# Patient Record
Sex: Female | Born: 1965 | Race: White | Hispanic: No | Smoking: Never smoker
Health system: Southern US, Community
[De-identification: ages and names within clinical notes are randomized; demographics above are authoritative.]

## PROBLEM LIST (undated history)

## (undated) DIAGNOSIS — R112 Nausea with vomiting, unspecified: Secondary | ICD-10-CM

## (undated) DIAGNOSIS — F32A Depression, unspecified: Secondary | ICD-10-CM

## (undated) DIAGNOSIS — F329 Major depressive disorder, single episode, unspecified: Secondary | ICD-10-CM

## (undated) DIAGNOSIS — Z460 Encounter for fitting and adjustment of spectacles and contact lenses: Secondary | ICD-10-CM

## (undated) DIAGNOSIS — I10 Essential (primary) hypertension: Secondary | ICD-10-CM

## (undated) DIAGNOSIS — Z9889 Other specified postprocedural states: Secondary | ICD-10-CM

## (undated) DIAGNOSIS — S83511A Sprain of anterior cruciate ligament of right knee, initial encounter: Secondary | ICD-10-CM

## (undated) DIAGNOSIS — S83281A Other tear of lateral meniscus, current injury, right knee, initial encounter: Secondary | ICD-10-CM

## (undated) HISTORY — PX: BREAST EXCISIONAL BIOPSY: SUR124

---

## 1998-01-20 ENCOUNTER — Ambulatory Visit (HOSPITAL_COMMUNITY): Admission: RE | Admit: 1998-01-20 | Discharge: 1998-01-20 | Payer: Self-pay | Admitting: Obstetrics and Gynecology

## 1998-04-19 ENCOUNTER — Other Ambulatory Visit: Admission: RE | Admit: 1998-04-19 | Discharge: 1998-04-19 | Payer: Self-pay | Admitting: Obstetrics & Gynecology

## 1998-11-18 ENCOUNTER — Inpatient Hospital Stay (HOSPITAL_COMMUNITY): Admission: AD | Admit: 1998-11-18 | Discharge: 1998-11-20 | Payer: Self-pay | Admitting: Obstetrics and Gynecology

## 1999-11-26 HISTORY — PX: BREAST SURGERY: SHX581

## 1999-11-26 HISTORY — PX: BREAST EXCISIONAL BIOPSY: SUR124

## 2012-11-25 HISTORY — PX: ENDOMETRIAL ABLATION: SHX621

## 2014-01-20 ENCOUNTER — Other Ambulatory Visit: Payer: Self-pay | Admitting: Orthopedic Surgery

## 2014-01-20 ENCOUNTER — Encounter (HOSPITAL_BASED_OUTPATIENT_CLINIC_OR_DEPARTMENT_OTHER): Payer: Self-pay | Admitting: *Deleted

## 2014-01-20 NOTE — Progress Notes (Signed)
[facility] and e[facility]-they did not do labs

## 2014-01-21 ENCOUNTER — Encounter (HOSPITAL_BASED_OUTPATIENT_CLINIC_OR_DEPARTMENT_OTHER)
Admission: RE | Disposition: A | Discharge status: Home / Self Care | Payer: Self-pay | Source: Ambulatory Visit | Attending: Orthopedic Surgery

## 2014-01-21 ENCOUNTER — Encounter (HOSPITAL_BASED_OUTPATIENT_CLINIC_OR_DEPARTMENT_OTHER): Payer: Self-pay | Admitting: Certified Registered"

## 2014-01-21 ENCOUNTER — Ambulatory Visit (HOSPITAL_BASED_OUTPATIENT_CLINIC_OR_DEPARTMENT_OTHER)
Admission: RE | Admit: 2014-01-21 | Discharge: 2014-01-21 | Disposition: A | Discharge status: Home / Self Care | Payer: Managed Care, Other (non HMO) | Source: Ambulatory Visit | Attending: Orthopedic Surgery | Admitting: Orthopedic Surgery

## 2014-01-21 ENCOUNTER — Ambulatory Visit (HOSPITAL_BASED_OUTPATIENT_CLINIC_OR_DEPARTMENT_OTHER): Payer: Managed Care, Other (non HMO) | Admitting: Certified Registered"

## 2014-01-21 ENCOUNTER — Encounter (HOSPITAL_BASED_OUTPATIENT_CLINIC_OR_DEPARTMENT_OTHER): Payer: Managed Care, Other (non HMO) | Admitting: Certified Registered"

## 2014-01-21 DIAGNOSIS — S83281A Other tear of lateral meniscus, current injury, right knee, initial encounter: Secondary | ICD-10-CM

## 2014-01-21 DIAGNOSIS — I10 Essential (primary) hypertension: Secondary | ICD-10-CM | POA: Insufficient documentation

## 2014-01-21 DIAGNOSIS — Y9323 Activity, snow (alpine) (downhill) skiing, snow boarding, sledding, tobogganing and snow tubing: Secondary | ICD-10-CM | POA: Insufficient documentation

## 2014-01-21 DIAGNOSIS — S83509A Sprain of unspecified cruciate ligament of unspecified knee, initial encounter: Secondary | ICD-10-CM | POA: Insufficient documentation

## 2014-01-21 DIAGNOSIS — S83511A Sprain of anterior cruciate ligament of right knee, initial encounter: Secondary | ICD-10-CM

## 2014-01-21 DIAGNOSIS — S83289A Other tear of lateral meniscus, current injury, unspecified knee, initial encounter: Secondary | ICD-10-CM | POA: Insufficient documentation

## 2014-01-21 DIAGNOSIS — F329 Major depressive disorder, single episode, unspecified: Secondary | ICD-10-CM | POA: Insufficient documentation

## 2014-01-21 DIAGNOSIS — F3289 Other specified depressive episodes: Secondary | ICD-10-CM | POA: Insufficient documentation

## 2014-01-21 HISTORY — DX: Other specified postprocedural states: Z98.890

## 2014-01-21 HISTORY — DX: Other tear of lateral meniscus, current injury, right knee, initial encounter: S83.281A

## 2014-01-21 HISTORY — DX: Sprain of anterior cruciate ligament of right knee, initial encounter: S83.511A

## 2014-01-21 HISTORY — DX: Essential (primary) hypertension: I10

## 2014-01-21 HISTORY — PX: ANTERIOR CRUCIATE LIGAMENT REPAIR: SHX115

## 2014-01-21 HISTORY — DX: Other specified postprocedural states: R11.2

## 2014-01-21 HISTORY — DX: Depression, unspecified: F32.A

## 2014-01-21 HISTORY — DX: Encounter for fitting and adjustment of spectacles and contact lenses: Z46.0

## 2014-01-21 HISTORY — DX: Nausea with vomiting, unspecified: R11.2

## 2014-01-21 HISTORY — DX: Major depressive disorder, single episode, unspecified: F32.9

## 2014-01-21 LAB — POCT I-STAT, CHEM 8
BUN: 21 mg/dL (ref 6–23)
Calcium, Ion: 1.24 mmol/L — ABNORMAL HIGH (ref 1.12–1.23)
Chloride: 104 mEq/L (ref 96–112)
Creatinine, Ser: 0.7 mg/dL (ref 0.50–1.10)
GLUCOSE: 81 mg/dL (ref 70–99)
HCT: 42 % (ref 36.0–46.0)
HEMOGLOBIN: 14.3 g/dL (ref 12.0–15.0)
Potassium: 3.9 mEq/L (ref 3.7–5.3)
Sodium: 141 mEq/L (ref 137–147)
TCO2: 28 mmol/L (ref 0–100)

## 2014-01-21 SURGERY — RECONSTRUCTION, KNEE, ACL, USING HAMSTRING GRAFT
Anesthesia: Regional | Site: Knee | Laterality: Right

## 2014-01-21 MED ORDER — LACTATED RINGERS IV SOLN
INTRAVENOUS | Status: DC
  Administered 2014-01-21 (×2): via INTRAVENOUS

## 2014-01-21 MED ORDER — CEFAZOLIN SODIUM-DEXTROSE 2-3 GM-% IV SOLR
INTRAVENOUS | Status: AC
  Filled 2014-01-21: qty 50

## 2014-01-21 MED ORDER — MIDAZOLAM HCL 2 MG/2ML IJ SOLN: 0.5000 mg | Freq: Once | INTRAMUSCULAR | Status: DC

## 2014-01-21 MED ORDER — HYDROMORPHONE HCL PF 1 MG/ML IJ SOLN
INTRAMUSCULAR | Status: AC
  Filled 2014-01-21: qty 1

## 2014-01-21 MED ORDER — DEXAMETHASONE SODIUM PHOSPHATE 10 MG/ML IJ SOLN
INTRAMUSCULAR | Status: DC | PRN
  Administered 2014-01-21: 10 mg via INTRAVENOUS

## 2014-01-21 MED ORDER — PROMETHAZINE HCL 25 MG PO TABS: 25.0000 mg | ORAL_TABLET | Freq: Four times a day (QID) | ORAL | Status: DC | PRN

## 2014-01-21 MED ORDER — SODIUM CHLORIDE 0.9 % IR SOLN
Status: DC | PRN
  Administered 2014-01-21: 9000 mL

## 2014-01-21 MED ORDER — CEFAZOLIN SODIUM-DEXTROSE 2-3 GM-% IV SOLR
2.0000 g | INTRAVENOUS | Status: AC
  Administered 2014-01-21: 2 g via INTRAVENOUS

## 2014-01-21 MED ORDER — FENTANYL CITRATE 0.05 MG/ML IJ SOLN
INTRAMUSCULAR | Status: AC
  Filled 2014-01-21: qty 2

## 2014-01-21 MED ORDER — ONDANSETRON HCL 4 MG/2ML IJ SOLN
INTRAMUSCULAR | Status: DC | PRN
  Administered 2014-01-21: 4 mg via INTRAVENOUS

## 2014-01-21 MED ORDER — EPHEDRINE SULFATE 50 MG/ML IJ SOLN
INTRAMUSCULAR | Status: DC | PRN
  Administered 2014-01-21: 10 mg via INTRAVENOUS

## 2014-01-21 MED ORDER — PROPOFOL 10 MG/ML IV BOLUS
INTRAVENOUS | Status: DC | PRN
  Administered 2014-01-21: 150 mg via INTRAVENOUS

## 2014-01-21 MED ORDER — OXYCODONE-ACETAMINOPHEN 10-325 MG PO TABS: 1.0000 | ORAL_TABLET | Freq: Four times a day (QID) | ORAL | Status: DC | PRN

## 2014-01-21 MED ORDER — PROMETHAZINE HCL 25 MG PO TABS
ORAL_TABLET | ORAL | Status: AC
  Filled 2014-01-21: qty 1

## 2014-01-21 MED ORDER — METHOCARBAMOL 500 MG PO TABS
500.0000 mg | ORAL_TABLET | Freq: Four times a day (QID) | ORAL | Status: DC
Start: 2014-01-21 — End: 2017-05-06

## 2014-01-21 MED ORDER — SENNA-DOCUSATE SODIUM 8.6-50 MG PO TABS: 2.0000 | ORAL_TABLET | Freq: Every day | ORAL | Status: DC

## 2014-01-21 MED ORDER — OXYCODONE HCL 5 MG PO TABS
ORAL_TABLET | ORAL | Status: AC
  Filled 2014-01-21: qty 1

## 2014-01-21 MED ORDER — MIDAZOLAM HCL 2 MG/2ML IJ SOLN
INTRAMUSCULAR | Status: AC
  Filled 2014-01-21: qty 2

## 2014-01-21 MED ORDER — FENTANYL CITRATE 0.05 MG/ML IJ SOLN
INTRAMUSCULAR | Status: DC | PRN
  Administered 2014-01-21 (×5): 25 ug via INTRAVENOUS

## 2014-01-21 MED ORDER — FENTANYL CITRATE 0.05 MG/ML IJ SOLN
INTRAMUSCULAR | Status: AC
  Filled 2014-01-21: qty 6

## 2014-01-21 MED ORDER — PROMETHAZINE HCL 25 MG PO TABS
25.0000 mg | ORAL_TABLET | Freq: Once | ORAL | Status: AC
  Administered 2014-01-21: 25 mg via ORAL

## 2014-01-21 MED ORDER — MIDAZOLAM HCL 5 MG/5ML IJ SOLN
INTRAMUSCULAR | Status: DC | PRN
  Administered 2014-01-21: 2 mg via INTRAVENOUS

## 2014-01-21 MED ORDER — BUPIVACAINE-EPINEPHRINE PF 0.5-1:200000 % IJ SOLN
INTRAMUSCULAR | Status: DC | PRN
  Administered 2014-01-21: 30 mL via PERINEURAL

## 2014-01-21 MED ORDER — FENTANYL CITRATE 0.05 MG/ML IJ SOLN
50.0000 ug | INTRAMUSCULAR | Status: DC | PRN
  Administered 2014-01-21: 100 ug via INTRAVENOUS

## 2014-01-21 MED ORDER — MIDAZOLAM HCL 2 MG/2ML IJ SOLN
1.0000 mg | INTRAMUSCULAR | Status: DC | PRN
  Administered 2014-01-21: 2 mg via INTRAVENOUS

## 2014-01-21 MED ORDER — LIDOCAINE HCL (CARDIAC) 20 MG/ML IV SOLN
INTRAVENOUS | Status: DC | PRN
  Administered 2014-01-21: 30 mg via INTRAVENOUS

## 2014-01-21 MED ORDER — OXYCODONE HCL 5 MG/5ML PO SOLN: 5.0000 mg | Freq: Once | ORAL | Status: AC | PRN

## 2014-01-21 MED ORDER — HYDROMORPHONE HCL PF 1 MG/ML IJ SOLN
0.2500 mg | INTRAMUSCULAR | Status: DC | PRN
  Administered 2014-01-21 (×3): 0.5 mg via INTRAVENOUS

## 2014-01-21 MED ORDER — OXYCODONE HCL 5 MG PO TABS
5.0000 mg | ORAL_TABLET | Freq: Once | ORAL | Status: AC | PRN
  Administered 2014-01-21: 5 mg via ORAL

## 2014-01-21 SURGICAL SUPPLY — 81 items
ANCHOR BUTTON TIGHTROPE ACL RT (Orthopedic Implant) ×1 IMPLANT
APL SKNCLS STERI-STRIP NONHPOA (GAUZE/BANDAGES/DRESSINGS) ×1
BANDAGE ELASTIC 6 VELCRO ST LF (GAUZE/BANDAGES/DRESSINGS) ×1 IMPLANT
BANDAGE ESMARK 6X9 LF (GAUZE/BANDAGES/DRESSINGS) IMPLANT
BENZOIN TINCTURE PRP APPL 2/3 (GAUZE/BANDAGES/DRESSINGS) ×2 IMPLANT
BLADE 4.2CUDA (BLADE) IMPLANT
BLADE CUDA GRT WHITE 3.5 (BLADE) IMPLANT
BLADE CUDA SHAVER 3.5 (BLADE) IMPLANT
BLADE CUTTER GATOR 3.5 (BLADE) ×2 IMPLANT
BLADE GREAT WHITE 4.2 (BLADE) IMPLANT
BLADE SURG 15 STRL LF DISP TIS (BLADE) ×1 IMPLANT
BLADE SURG 15 STRL SS (BLADE) ×2
BNDG CMPR 9X6 STRL LF SNTH (GAUZE/BANDAGES/DRESSINGS)
BNDG ESMARK 6X9 LF (GAUZE/BANDAGES/DRESSINGS)
BUR OVAL 4.0 (BURR) ×2 IMPLANT
BUR OVAL 6.0 (BURR) IMPLANT
CANISTER SUCT 3000ML (MISCELLANEOUS) IMPLANT
CINCH MENISCAL (Anchor) IMPLANT
COVER TABLE BACK 60X90 (DRAPES) ×3 IMPLANT
CUFF TOURNIQUET SINGLE 34IN LL (TOURNIQUET CUFF) ×1 IMPLANT
CUTTER KNOT PUSHER 2-0 FIBERWI (INSTRUMENTS) ×1 IMPLANT
CUTTER MENISCUS  4.2MM (BLADE)
CUTTER MENISCUS 4.2MM (BLADE) IMPLANT
DRAPE ARTHROSCOPY W/POUCH 114 (DRAPES) ×2 IMPLANT
DRAPE OEC MINIVIEW 54X84 (DRAPES) ×2 IMPLANT
DRAPE U-SHAPE 47X51 STRL (DRAPES) ×2 IMPLANT
DRILL FLIPCUTTER II 9.0MM (INSTRUMENTS) IMPLANT
DURAPREP 26ML APPLICATOR (WOUND CARE) ×2 IMPLANT
ELECT REM PT RETURN 9FT ADLT (ELECTROSURGICAL) ×2
ELECTRODE REM PT RTRN 9FT ADLT (ELECTROSURGICAL) ×1 IMPLANT
FIBERSTICK 2 (SUTURE) ×2 IMPLANT
FLIPCUTTER II 9.0MM (INSTRUMENTS) ×2
GLOVE BIO SURGEON STRL SZ8 (GLOVE) ×2 IMPLANT
GLOVE BIOGEL PI IND STRL 8 (GLOVE) ×3 IMPLANT
GLOVE BIOGEL PI INDICATOR 8 (GLOVE) ×2
GLOVE ORTHO TXT STRL SZ7.5 (GLOVE) ×3 IMPLANT
GOWN STRL REUS W/ TWL LRG LVL3 (GOWN DISPOSABLE) ×1 IMPLANT
GOWN STRL REUS W/ TWL XL LVL3 (GOWN DISPOSABLE) ×4 IMPLANT
GOWN STRL REUS W/TWL LRG LVL3 (GOWN DISPOSABLE) ×2
GOWN STRL REUS W/TWL XL LVL3 (GOWN DISPOSABLE) ×4
GRAFT TISS ANT TIB TNDN (Tissue) IMPLANT
HOLDER KNEE FOAM BLUE (MISCELLANEOUS) ×1 IMPLANT
IMMOBILIZER KNEE 22 UNIV (SOFTGOODS) ×1 IMPLANT
IMMOBILIZER KNEE 24 THIGH 36 (MISCELLANEOUS) ×1 IMPLANT
IMMOBILIZER KNEE 24 UNIV (MISCELLANEOUS) ×2
KIT TRANSTIBIAL (DISPOSABLE) ×2 IMPLANT
KNEE WRAP E Z 3 GEL PACK (MISCELLANEOUS) ×2 IMPLANT
MANIFOLD NEPTUNE II (INSTRUMENTS) ×2 IMPLANT
MENISCAL CINCH (Anchor) ×2 IMPLANT
NS IRRIG 1000ML POUR BTL (IV SOLUTION) ×2 IMPLANT
PACK ARTHROSCOPY DSU (CUSTOM PROCEDURE TRAY) ×2 IMPLANT
PACK BASIN DAY SURGERY FS (CUSTOM PROCEDURE TRAY) ×2 IMPLANT
PAD CAST 4YDX4 CTTN HI CHSV (CAST SUPPLIES) ×1 IMPLANT
PADDING CAST COTTON 4X4 STRL (CAST SUPPLIES) ×2
PADDING CAST COTTON 6X4 STRL (CAST SUPPLIES) ×2 IMPLANT
PENCIL BUTTON HOLSTER BLD 10FT (ELECTRODE) IMPLANT
SCREW BIOCOMPOSITE 10X35 (Screw) ×1 IMPLANT
SET ARTHROSCOPY TUBING (MISCELLANEOUS) ×2
SET ARTHROSCOPY TUBING LN (MISCELLANEOUS) ×1 IMPLANT
SLEEVE SCD COMPRESS KNEE MED (MISCELLANEOUS) ×2 IMPLANT
SPONGE GAUZE 4X4 12PLY (GAUZE/BANDAGES/DRESSINGS) ×2 IMPLANT
SPONGE LAP 4X18 X RAY DECT (DISPOSABLE) ×2 IMPLANT
STRIP CLOSURE SKIN 1/2X4 (GAUZE/BANDAGES/DRESSINGS) ×2 IMPLANT
SUCTION FRAZIER TIP 10 FR DISP (SUCTIONS) ×1 IMPLANT
SUT 2 FIBERLOOP 20 STRT BLUE (SUTURE) ×4
SUT FIBERWIRE #2 38 T-5 BLUE (SUTURE)
SUT MNCRL AB 4-0 PS2 18 (SUTURE) ×2 IMPLANT
SUT VIC AB 0 CT1 27 (SUTURE)
SUT VIC AB 0 CT1 27XBRD ANBCTR (SUTURE) IMPLANT
SUT VIC AB 2-0 SH 27 (SUTURE)
SUT VIC AB 2-0 SH 27XBRD (SUTURE) IMPLANT
SUT VIC AB 3-0 SH 27 (SUTURE)
SUT VIC AB 3-0 SH 27X BRD (SUTURE) IMPLANT
SUT VICRYL 3-0 CR8 SH (SUTURE) IMPLANT
SUT VICRYL 4-0 PS2 18IN ABS (SUTURE) ×1 IMPLANT
SUTURE 2 FIBERLOOP 20 STRT BLU (SUTURE) IMPLANT
SUTURE FIBERWR #2 38 T-5 BLUE (SUTURE) IMPLANT
TENDON ANTERIOR TIBIALIS (Tissue) ×2 IMPLANT
TOWEL OR 17X24 6PK STRL BLUE (TOWEL DISPOSABLE) ×2 IMPLANT
WAND STAR VAC 90 (SURGICAL WAND) ×2 IMPLANT
WATER STERILE IRR 1000ML POUR (IV SOLUTION) ×2 IMPLANT

## 2014-01-21 NOTE — Anesthesia Postprocedure Evaluation (Signed)
Anesthesia Post Note  Patient: Sheralyn BoatmanLaura Freeburg  Procedure(s) Performed: Procedure(s) (LRB): LATERAL  MENISCAL REPAIR ANTERIOR CRUCIATE LIGAMENT ( ACL) RECONSTRUCTION  RIGHT KNEE WITH ALLOGRAFT (Right)  Anesthesia type: General  Patient location: PACU  Post pain: Pain level controlled and Adequate analgesia  Post assessment: Post-op Vital signs reviewed, Patient's Cardiovascular Status Stable, Respiratory Function Stable, Patent Airway and Pain level controlled  Last Vitals:  Filed Vitals:   01/21/14 1500  BP: 126/73  Pulse: 96  Temp:   Resp: 13    Post vital signs: Reviewed and stable  Level of consciousness: awake, alert  and oriented  Complications: No apparent anesthesia complications

## 2014-01-21 NOTE — Op Note (Signed)
01/21/2014  2:36 PM  PATIENT:  Abigail Garza    PRE-OPERATIVE DIAGNOSIS:  LATERAL & MEDIAL MENISCAL AND ACL TEAR RIGHT KNEE  POST-OPERATIVE DIAGNOSIS:  Right anterior cruciate ligament tear and lateral meniscal tear, no evidence for medial meniscus tear  PROCEDURE:  LATERAL  MENISCAL REPAIR AND ANTERIOR CRUCIATE LIGAMENT ( ACL) RECONSTRUCTION  RIGHT KNEE WITH ALLOGRAFT  SURGEON:  Eulas Post, MD  PHYSICIAN ASSISTANT: Janace Litten, OPA-C, present and scrubbed throughout the case, critical for completion in a timely fashion, and for retraction, instrumentation, and closure.  ANESTHESIA:   General  PREOPERATIVE INDICATIONS:  Abigail Garza is a  48 y.o. female who injured her knee while skiing, and elected for surgical management. She wanted to maintain the ability to do cutting and twisting pivoting activities. The risks benefits and alternatives were discussed with the patient preoperatively including but not limited to the risks of infection, bleeding, nerve injury, stiffness, cardiopulmonary complications, the need for revision surgery, recurrent instability, progression of arthritis, the potential for use of a allograft and related disease transmission risks, among others and the patient was willing to proceed.  .  OPERATIVE IMPLANTS: Arthrex anterior cruciate ligament tightrope, bio composite tibial interference screw size 10 x 35 mm and size 9 tibialis anterior allograft with an Arthrex meniscal cinch times one for the lateral meniscus  OPERATIVE FINDINGS: The anterior cruciate ligament was completely torn. The PCL was intact. The posterior lateral corner was intact to dial testing, and I examined this very closely compared to the contralateral side. Lateral collateral ligament was also intact. The medial meniscus was probed thoroughly, and found to be intact, with no loose fragments or instability. The lateral meniscus had a peripheral tear, that was in the red red zone, and was  amenable to repair. The superior aspect of the tear was less than a centimeter, but inferiorly was longer than a centimeter, but did not extend to the central portion of the meniscus. The patellofemoral joint was intact, the gutters were normal, and the chondral integrity of the medial and lateral compartment was in reasonably good condition.   OPERATIVE PROCEDURE: The patient was brought to the operating room and placed in the supine position. General anesthesia was administered. IV antibiotics were given. The lower extremity was prepped and draped in usual sterile fashion. Exam under anesthesia demonstrated the above-named findings. Time out was performed.  Knee arthroscopy was then performed, and the above named findings were noted.    The anterior cruciate ligament was torn.  I used the meniscal cinch in a vertical mattress configuration to repair the lateral meniscus. The depth stop was set to 16 mm. Excellent soft tissue tension and restoration of meniscal integrity was achieved.  I then removed the previous anterior cruciate ligament stump, and performed a mild notchplasty.  The outside in guide was then applied to the appropriate position and the 9 retro-cutter was used to drill the femoral socket. Care was taken to maintain the cortical bridge.  I then drilled the tibial tunnel using the retro-cutter, and opened the cortex with a reamer. All the soft tissue remnants were removed and cleaned at the aperture of the tunnel.  I also dilated with the appropriate dilators.  The passing suture was delivered through the tibia, and then the button and graft delivered up into the femoral tunnel.  The button was flipped and confirmed under live fluoroscopy. I then tensioned the anterior cruciate ligament tightrope, and deliver the graft up into the femoral tunnel. Over 25 mm  of graft was in the femoral tunnel. I confirmed once more with the fluoroscopy that the button was flipped appropriately on  the femoral cortex.  I then cycled the knee, eliminated all of the creep, and I had excellent isometry. I then applied tension, and the Arthrex bio composite interference screw into the tibia placing a reverse Lachman maneuver on the tibia and femur. I removed the guide pin prior to completely seating the screw.  Excellent fixation was achieved on both the femoral and tibial side, and the wounds were irrigated copiously and the sartorius fascia repaired with Vicryl, and the portals repaired with Monocryl with Steri-Strips and sterile gauze.  The patient was awakened and returned to PACU in stable and satisfactory condition. There were no complications and She tolerated the procedure well.

## 2014-01-21 NOTE — Anesthesia Preprocedure Evaluation (Addendum)
Anesthesia Evaluation  Patient identified by MRN, date of birth, ID band Patient awake    Reviewed: Allergy & Precautions, H&P , NPO status , Patient's Chart, lab work & pertinent test results  History of Anesthesia Complications (+) PONV and history of anesthetic complications  Airway Mallampati: II TM Distance: >3 FB Neck ROM: Full    Dental no notable dental hx. (+) Teeth Intact, Dental Advisory Given   Pulmonary neg pulmonary ROS,  breath sounds clear to auscultation  Pulmonary exam normal       Cardiovascular hypertension, On Medications Rhythm:Regular Rate:Normal     Neuro/Psych Depression negative neurological ROS     GI/Hepatic negative GI ROS, Neg liver ROS,   Endo/Other  negative endocrine ROS  Renal/GU negative Renal ROS  negative genitourinary   Musculoskeletal   Abdominal   Peds  Hematology negative hematology ROS (+)   Anesthesia Other Findings   Reproductive/Obstetrics negative OB ROS                          Anesthesia Physical Anesthesia Plan  ASA: II  Anesthesia Plan: General and Regional   Post-op Pain Management:    Induction: Intravenous  Airway Management Planned: LMA  Additional Equipment:   Intra-op Plan:   Post-operative Plan: Extubation in OR  Informed Consent: I have reviewed the patients History and Physical, chart, labs and discussed the procedure including the risks, benefits and alternatives for the proposed anesthesia with the patient or authorized representative who has indicated his/her understanding and acceptance.   Dental advisory given  Plan Discussed with: CRNA  Anesthesia Plan Comments:         Anesthesia Quick Evaluation

## 2014-01-21 NOTE — Discharge Instructions (Signed)
Diet: As you were doing prior to hospitalization   Shower:  May shower but keep the wounds dry, use an occlusive plastic wrap, NO SOAKING IN TUB.  If the bandage gets wet, change with a clean dry gauze.  Dressing:  You may change your dressing 3-5 days after surgery.  Then change the dressing daily with sterile gauze dressing.    There are sticky tapes (steri-strips) on your wounds and all the stitches are absorbable.  Leave the steri-strips in place when changing your dressings, they will peel off with time, usually 2-3 weeks.  Activity:  Increase activity slowly as tolerated, but follow the weight bearing instructions below.  No lifting or driving for 6 weeks.  Weight Bearing:   Nonweightbearing right leg for 6 weeks.    To prevent constipation: you may use a stool softener such as -  Colace (over the counter) 100 mg by mouth twice a day  Drink plenty of fluids (prune juice may be helpful) and high fiber foods Miralax (over the counter) for constipation as needed.    Itching:  If you experience itching with your medications, try taking only a single pain pill, or even half a pain pill at a time.  You may take up to 10 pain pills per day, and you can also use benadryl over the counter for itching or also to help with sleep.   Precautions:  If you experience chest pain or shortness of breath - call 911 immediately for transfer to the hospital emergency department!!  If you develop a fever greater that 101 F, purulent drainage from wound, increased redness or drainage from wound, or calf pain -- Call the office at [phone]                                                Follow- Up Appointment:  Please call for an appointment to be [place] in 2 [place] [phone]   Post Anesthesia Home Care Instructions  Activity: Get plenty of rest for the remainder of the day. A responsible adult should stay with you for 24 hours following the procedure.  For the next 24 hours, DO  NOT: -Drive a car -Advertising copywriterperate machinery -Drink alcoholic beverages -Take any medication unless instructed by your physician -Make any legal decisions or sign important papers.  Meals: Start with liquid foods such as gelatin or soup. Progress to regular foods as tolerated. Avoid greasy, spicy, heavy foods. If nausea and/or vomiting occur, drink only clear liquids until the nausea and/or vomiting subsides. Call your physician if vomiting continues.  Special Instructions/Symptoms: Your throat may feel dry or sore from the anesthesia or the breathing tube placed in your throat during surgery. If this causes discomfort, gargle with warm salt water. The discomfort should disappear within 24 hours. Regional Anesthesia Blocks  1. Numbness or the inability to move the "blocked" extremity may last from 3-48 hours after placement. The length of time depends on the medication injected and your individual response to the medication. If the numbness is not going away after 48 hours, call your surgeon.  2. The extremity that is blocked will need to be protected until the numbness is gone and the  Strength has returned. Because you cannot feel it, you will need to take extra care to avoid injury. Because it may be weak, you may have difficulty moving  it or using it. You may not know what position it is in without looking at it while the block is in effect.  3. For blocks in the legs and feet, returning to weight bearing and walking needs to be done carefully. You will need to wait until the numbness is entirely gone and the strength has returned. You should be able to move your leg and foot normally before you try and bear weight or walk. You will need someone to be with you when you first try to ensure you do not fall and possibly risk injury.  4. Bruising and tenderness at the needle site are common side effects and will resolve in a few days.  5. Persistent numbness or new problems with movement should be  communicated to the surgeon or the [facility] [phone] [facility] [phone]).

## 2014-01-21 NOTE — Progress Notes (Signed)
Assisted Dr. Fitzgerald with right, ultrasound guided, femoral block. Side rails up, monitors on throughout procedure. See vital signs in flow sheet. Tolerated Procedure well. 

## 2014-01-21 NOTE — H&P (Signed)
  PREOPERATIVE H&P  Chief Complaint: LATERNAL MEDIAL MENISCAL AND ACL TEAR RIGHT KNEE  HPI: Abigail Garza is a 48 y.o. female who presents for preoperative history and physical with a diagnosis of LATERNAL MEDIAL MENISCAL AND ACL TEAR RIGHT KNEE. Symptoms are rated as moderate to severe, and have been worsening.  This is significantly impairing activities of daily living.  She has elected for surgical management. This occurred after a skiing accident. She's had severe pain, with episodes of instability.  Past Medical History  Diagnosis Date  . Hypertension   . Depression   . PONV (postoperative nausea and vomiting)   . Contact lens/glasses fitting     wears contacts or glasses   Past Surgical History  Procedure Laterality Date  . Endometrial ablation  2014  . Breast surgery  2001    lt br bx   History   Social History  . Marital Status: Divorced    Spouse Name: N/A    Number of Children: N/A  . Years of Education: N/A   Social History Main Topics  . Smoking status: Never Smoker   . Smokeless tobacco: None  . Alcohol Use: Yes     Comment: occ  . Drug Use: No  . Sexual Activity: None   Other Topics Concern  . None   Social History Narrative  . None   History reviewed. No pertinent family history. Allergies  Allergen Reactions  . Sulfa Antibiotics Rash   Prior to Admission medications   Medication Sig Start Date End Date Taking? Authorizing Provider  citalopram (CELEXA) 10 MG tablet Take 10 mg by mouth daily.   Yes Historical Provider, MD  furosemide (LASIX) 20 MG tablet Take 20 mg by mouth. Takes 1/2   Yes Historical Provider, MD  losartan (COZAAR) 50 MG tablet Take 50 mg by mouth daily.   Yes Historical Provider, MD     Positive ROS: All other systems have been reviewed and were otherwise negative with the exception of those mentioned in the HPI and as above.  Physical Exam: General: Alert, no acute distress Cardiovascular: No pedal edema Respiratory: No  cyanosis, no use of accessory musculature GI: No organomegaly, abdomen is soft and non-tender Skin: No lesions in the area of chief complaint Neurologic: Sensation intact distally Psychiatric: Patient is competent for consent with normal mood and affect Lymphatic: No axillary or cervical lymphadenopathy  MUSCULOSKELETAL: right knee has positive Lachman, medial and lateral joint line tenderness, her dial test is symmetric with the contralateral side, stable to varus stress.  Assessment: Lateral and MEDIAL MENISCAL AND ACL TEAR RIGHT KNEE  Plan: Plan for Procedure(s): LATERAL AND MEDIAL MENISCAL REPAIR versus meniscectomy with ANTERIOR CRUCIATE LIGAMENT ( ACL) RECONSTRUCTION  RIGHT KNEE WITH ALLOGRAFT  The risks benefits and alternatives were discussed with the patient including but not limited to the risks of nonoperative treatment, versus surgical intervention including infection, bleeding, nerve injury,  blood clots, cardiopulmonary complications, morbidity, mortality, among others, and they were willing to proceed. We've also discussed the risk for re\re rupture, stiffness, posttraumatic arthritis, the potential for other associated ligamentous injury.  Eulas PostLANDAU,Amberley Hamler P, MD Cell [phone]   01/21/2014 11:35 AM

## 2014-01-21 NOTE — Transfer of Care (Signed)
Immediate Anesthesia Transfer of Care Note  Patient: Abigail BoatmanLaura Garza  Procedure(s) Performed: Procedure(s): LATERAL  MENISCAL REPAIR ANTERIOR CRUCIATE LIGAMENT ( ACL) RECONSTRUCTION  RIGHT KNEE WITH ALLOGRAFT (Right)  Patient Location: PACU  Anesthesia Type:GA combined with regional for post-op pain  Level of Consciousness: awake and patient cooperative  Airway & Oxygen Therapy: Patient Spontanous Breathing and Patient connected to face mask oxygen  Post-op Assessment: Report given to PACU RN and Post -op Vital signs reviewed and stable  Post vital signs: Reviewed and stable  Complications: No apparent anesthesia complications

## 2014-01-21 NOTE — Anesthesia Procedure Notes (Addendum)
Anesthesia Regional Block:  Femoral nerve block  Pre-Anesthetic Checklist: ,, timeout performed, Correct Patient, Correct Site, Correct Laterality, Correct Procedure, Correct Position, site marked, Risks and benefits discussed, pre-op evaluation,  At surgeon's request and post-op pain management  Laterality: Right  Prep: Maximum Sterile Barrier Precautions used and chloraprep       Needles:  Injection technique: Single-shot  Needle Type: Echogenic Stimulator Needle     Needle Length: 5cm 5 cm Needle Gauge: 22 and 22 G    Additional Needles:  Procedures: ultrasound guided (picture in chart) Femoral nerve block  Nerve Stimulator or Paresthesia:  Response: Patellar respose,   Additional Responses:   Narrative:  Start time: 01/21/2014 12:06 PM End time: 01/21/2014 12:16 PM Injection made incrementally with aspirations every 5 mL. Anesthesiologist: Fitzgerald,MD  Additional Notes: 2% Lidocaine skin wheel.    Procedure Name: LMA Insertion Date/Time: 01/21/2014 12:49 PM Performed by: Nalee Lightle Pre-anesthesia Checklist: Patient identified, Emergency Drugs available, Suction available and Patient being monitored Patient Re-evaluated:Patient Re-evaluated prior to inductionOxygen Delivery Method: Circle System Utilized Preoxygenation: Pre-oxygenation with 100% oxygen Intubation Type: IV induction Ventilation: Mask ventilation without difficulty LMA: LMA inserted LMA Size: 4.0 Number of attempts: 1 Airway Equipment and Method: bite block Placement Confirmation: positive ETCO2 Tube secured with: Tape Dental Injury: Teeth and Oropharynx as per pre-operative assessment

## 2014-01-24 ENCOUNTER — Encounter (HOSPITAL_BASED_OUTPATIENT_CLINIC_OR_DEPARTMENT_OTHER): Payer: Self-pay | Admitting: Orthopedic Surgery

## 2017-05-06 ENCOUNTER — Ambulatory Visit (INDEPENDENT_AMBULATORY_CARE_PROVIDER_SITE_OTHER): Payer: Managed Care, Other (non HMO) | Admitting: Sports Medicine

## 2017-05-06 ENCOUNTER — Encounter: Payer: Self-pay | Admitting: Sports Medicine

## 2017-05-06 ENCOUNTER — Ambulatory Visit (INDEPENDENT_AMBULATORY_CARE_PROVIDER_SITE_OTHER): Payer: Managed Care, Other (non HMO)

## 2017-05-06 DIAGNOSIS — M205X2 Other deformities of toe(s) (acquired), left foot: Secondary | ICD-10-CM

## 2017-05-06 DIAGNOSIS — M779 Enthesopathy, unspecified: Secondary | ICD-10-CM

## 2017-05-06 NOTE — Progress Notes (Signed)
Subjective: Jeffie PollockLaura M Deckman is a 51 y.o. female patient who presents to office for evaluation of left foot pain. Patient complains of progressive pain especially over the last 4-6 monts in the left foot at the big toe joint. Reports that she had a podiatrist in [place] (Dr. Gershon CraneWayne-Gold) but has moved to [place] and wanted to have foot care here. Reports pain is a dull ache and is now interferring with daily activities especially walking/exercise. Patient has tried cortisone shoes, change in shoes and vailsali inserts with no relief in symptoms. Patient denies any other pedal complaints. Denies recent injury/trip/fall/sprain/any other causative factors.   Patient Active Problem List   Diagnosis Date Noted  . Complete tear of right ACL 01/21/2014  . Tear of lateral meniscus of right knee 01/21/2014    Current Outpatient Prescriptions on File Prior to Visit  Medication Sig Dispense Refill  . citalopram (CELEXA) 10 MG tablet Take 10 mg by mouth daily.    . furosemide (LASIX) 20 MG tablet Take 20 mg by mouth. Takes 1/2    . losartan (COZAAR) 50 MG tablet Take 50 mg by mouth daily.     No current facility-administered medications on file prior to visit.     Allergies  Allergen Reactions  . Lisinopril Other (See Comments)  . Meloxicam Other (See Comments)    Hives  . Sulfamethoxazole Other (See Comments)    Hives  . Sulfa Antibiotics Rash  . Sulfasalazine Rash    Objective:  General: Alert and oriented x3 in no acute distress  Dermatology: No open lesions bilateral lower extremities, no webspace macerations, no ecchymosis bilateral, all nails x 10 are well manicured.  Vascular: Dorsalis Pedis and Posterior Tibial pedal pulses palpable, Capillary Fill Time 3 seconds,(+) pedal hair growth bilateral, no gross edema bilateral lower extremities, Temperature gradient within normal limits.  Neurology: Gross sensation intact via light touch bilateral. (- )Tinels sign bilateral.    Musculoskeletal: Mild tenderness with palpation at left 1st MTPJ with end range of motion, no calf compression bilateral. Strength within normal limits in all groups bilateral.   Xrays  Left Foot   Impression: Normal osseous mineralization, 1st MTPJ joint space narrowing with dorsal spur and elevatus at 1st ray with mild 5th hammertoe.   Assessment and Plan: Problem List Items Addressed This Visit    None    Visit Diagnoses    Hallux limitus of left foot    -  Primary   Capsulitis       Relevant Orders   DG Foot Complete Left (Completed)       -Complete examination performed -Xrays reviewed -Discussed treatement options for hallux limitus/rigidus  -Patient declined repeat steroid injection -Dispensed dancer padding and advised patient that if this works well then would benefit from custom orthotics -Recommend good supportive shoes  -Patient to return to office as needed or sooner if condition worsens.  Asencion Islamitorya Mehtaab Mayeda, DPM

## 2017-05-06 NOTE — Patient Instructions (Signed)
Hallux Rigidus Hallux rigidus is a type of joint pain or joint disease (arthritis) that affects your big toe (hallux). This condition involves the joint that connects the base of your big toe to the main part of your foot (metatarsophalangeal joint). This condition can cause your big toe to become stiff, painful, and difficult to move. Symptoms may get worse with movement or in cold or damp weather. The condition also gets worse over time. What are the causes? This condition may be caused by having a foot that does not function the way that it should or has an abnormal shape (structural deformity). These foot problems can run in families (be hereditary). This condition can also be caused by:  Injury.  Overuse.  Certain inflammatory diseases, including gout and rheumatoid arthritis.  What increases the risk? This condition is more likely to develop in people who:  Have a foot bone (metatarsal) that is longer or higher than normal.  Have a family history of hallux rigidus.  Have previously injured their big toe.  Have feet that do not have a curve (arch) on the inner side of the foot. This may be called flat feet or fallen arches.  Turn their ankles in when they walk (pronation).  Have rheumatoid arthritis or gout.  Have to stoop down often at work.  What are the signs or symptoms? Symptoms of this condition include:  Big toe pain.  Stiffness and difficulty moving the big toe.  Swelling of the toe and surrounding area.  Bone spurs. These are bony growths that can form on the joint of the big toe.  A limp.  How is this diagnosed? This condition is diagnosed based on a medical history and physical exam. This may include X-rays. How is this treated? Treatment for this condition includes:  Wearing roomy, comfortable shoes that have a large toe box.  Putting orthotic devices in your shoes.  Pain medicines.  Physical therapy.  Icing the injured area.  Alternate  between putting your foot in cold water then warm water.  If your condition is severe, treatment may include:  Corticosteroid injections to relieve pain.  Surgery to remove bone spurs, fuse damaged bones together, or replace the entire joint.  Follow these instructions at home:  Take over-the-counter and prescription medicines only as told by your health care provider.  Do not wear high heels or other restrictive footwear. Wear comfortable, supportive shoes that have a large toe box.  Wear orthotics as told by your health care provider, if this applies.  Put your feet in cold water for 30 seconds, then in warm water for 30 seconds. Alternate between the cold and warm water for 5 minutes. Do this several times a day or as told by your health care provider.  If directed, apply ice to the injured area. ? Put ice in a plastic bag. ? Place a towel between your skin and the bag. ? Leave the ice on for 20 minutes, 2-3 times per day.  Do foot exercises as instructed by your health care provider or a physical therapist.  Keep all follow-up visits as told by your health care provider. This is important. Contact a health care provider if:  You notice bone spurs or growths on or around your big toe.  Your pain does not get better or it gets worse.  You have pain while resting.  You have pain in other parts of your body, such as your back, hip, or knee.  You start to limp.   This information is not intended to replace advice given to you by your health care provider. Make sure you discuss any questions you have with your health care provider. Document Released: 11/11/2005 Document Revised: 04/18/2016 Document Reviewed: 07/19/2015 Elsevier Interactive Patient Education  2018 [employer]  

## 2017-05-06 NOTE — Progress Notes (Signed)
   Subjective:    Patient ID: Abigail Garza, female    DOB: 1966/02/22, 51 y.o.   MRN: [id number]  HPI    Review of Systems     Objective:   Physical Exam        Assessment & Plan:

## 2017-07-22 ENCOUNTER — Other Ambulatory Visit: Payer: Self-pay | Admitting: Internal Medicine

## 2017-07-22 ENCOUNTER — Other Ambulatory Visit (HOSPITAL_COMMUNITY)
Admission: RE | Admit: 2017-07-22 | Discharge: 2017-07-22 | Disposition: A | Discharge status: Home / Self Care | Payer: Managed Care, Other (non HMO) | Source: Ambulatory Visit | Attending: Internal Medicine | Admitting: Internal Medicine

## 2017-07-22 DIAGNOSIS — R87810 Cervical high risk human papillomavirus (HPV) DNA test positive: Secondary | ICD-10-CM | POA: Diagnosis present

## 2017-07-22 DIAGNOSIS — Z124 Encounter for screening for malignant neoplasm of cervix: Secondary | ICD-10-CM | POA: Insufficient documentation

## 2017-07-25 LAB — CYTOLOGY - PAP
ADEQUACY: ABSENT
Diagnosis: NEGATIVE
HPV 16/18/45 GENOTYPING: NEGATIVE
HPV: DETECTED — AB

## 2018-05-15 ENCOUNTER — Ambulatory Visit: Payer: 59 | Admitting: Psychology

## 2018-05-15 DIAGNOSIS — F411 Generalized anxiety disorder: Secondary | ICD-10-CM | POA: Diagnosis not present

## 2018-05-27 ENCOUNTER — Ambulatory Visit: Payer: 59 | Admitting: Psychology

## 2018-05-27 DIAGNOSIS — F411 Generalized anxiety disorder: Secondary | ICD-10-CM

## 2018-06-18 ENCOUNTER — Ambulatory Visit: Payer: 59 | Admitting: Psychology

## 2018-06-18 DIAGNOSIS — F411 Generalized anxiety disorder: Secondary | ICD-10-CM | POA: Diagnosis not present

## 2018-07-14 ENCOUNTER — Ambulatory Visit: Payer: 59 | Admitting: Psychology

## 2018-07-14 DIAGNOSIS — F411 Generalized anxiety disorder: Secondary | ICD-10-CM | POA: Diagnosis not present

## 2018-07-28 ENCOUNTER — Ambulatory Visit: Payer: 59 | Admitting: Psychology

## 2018-07-28 DIAGNOSIS — F411 Generalized anxiety disorder: Secondary | ICD-10-CM

## 2018-08-04 ENCOUNTER — Ambulatory Visit
Admission: RE | Admit: 2018-08-04 | Discharge: 2018-08-04 | Disposition: A | Discharge status: Home / Self Care | Payer: Managed Care, Other (non HMO) | Source: Ambulatory Visit | Attending: Internal Medicine | Admitting: Internal Medicine

## 2018-08-04 ENCOUNTER — Other Ambulatory Visit: Payer: Self-pay | Admitting: Internal Medicine

## 2018-08-04 DIAGNOSIS — J328 Other chronic sinusitis: Secondary | ICD-10-CM

## 2018-08-18 ENCOUNTER — Ambulatory Visit: Payer: 59 | Admitting: Psychology

## 2018-08-18 DIAGNOSIS — F411 Generalized anxiety disorder: Secondary | ICD-10-CM | POA: Diagnosis not present

## 2018-09-10 ENCOUNTER — Ambulatory Visit: Payer: 59 | Admitting: Psychology

## 2018-09-10 DIAGNOSIS — F411 Generalized anxiety disorder: Secondary | ICD-10-CM | POA: Diagnosis not present

## 2018-09-22 ENCOUNTER — Ambulatory Visit: Payer: 59 | Admitting: Psychology

## 2018-09-29 ENCOUNTER — Ambulatory Visit: Payer: 59 | Admitting: Psychology

## 2018-09-29 DIAGNOSIS — F411 Generalized anxiety disorder: Secondary | ICD-10-CM

## 2018-10-14 ENCOUNTER — Ambulatory Visit: Payer: 59 | Admitting: Psychology

## 2018-10-14 DIAGNOSIS — F411 Generalized anxiety disorder: Secondary | ICD-10-CM

## 2018-10-30 ENCOUNTER — Ambulatory Visit: Payer: 59 | Admitting: Psychology

## 2018-10-30 DIAGNOSIS — F411 Generalized anxiety disorder: Secondary | ICD-10-CM | POA: Diagnosis not present

## 2018-11-19 ENCOUNTER — Ambulatory Visit: Payer: 59 | Admitting: Psychology

## 2018-11-19 DIAGNOSIS — F411 Generalized anxiety disorder: Secondary | ICD-10-CM | POA: Diagnosis not present

## 2018-12-02 ENCOUNTER — Ambulatory Visit (INDEPENDENT_AMBULATORY_CARE_PROVIDER_SITE_OTHER): Payer: 59 | Admitting: Psychology

## 2018-12-02 DIAGNOSIS — F411 Generalized anxiety disorder: Secondary | ICD-10-CM

## 2018-12-16 ENCOUNTER — Ambulatory Visit (INDEPENDENT_AMBULATORY_CARE_PROVIDER_SITE_OTHER): Payer: 59 | Admitting: Psychology

## 2018-12-16 DIAGNOSIS — F411 Generalized anxiety disorder: Secondary | ICD-10-CM | POA: Diagnosis not present

## 2019-01-01 ENCOUNTER — Ambulatory Visit (INDEPENDENT_AMBULATORY_CARE_PROVIDER_SITE_OTHER): Payer: 59 | Admitting: Psychology

## 2019-01-01 DIAGNOSIS — F411 Generalized anxiety disorder: Secondary | ICD-10-CM | POA: Diagnosis not present

## 2019-01-11 ENCOUNTER — Ambulatory Visit (INDEPENDENT_AMBULATORY_CARE_PROVIDER_SITE_OTHER): Payer: 59 | Admitting: Psychology

## 2019-01-11 DIAGNOSIS — F411 Generalized anxiety disorder: Secondary | ICD-10-CM

## 2019-01-27 ENCOUNTER — Ambulatory Visit (INDEPENDENT_AMBULATORY_CARE_PROVIDER_SITE_OTHER): Payer: 59 | Admitting: Psychology

## 2019-01-27 DIAGNOSIS — F411 Generalized anxiety disorder: Secondary | ICD-10-CM

## 2019-02-10 ENCOUNTER — Other Ambulatory Visit: Payer: Self-pay

## 2019-02-10 ENCOUNTER — Ambulatory Visit (INDEPENDENT_AMBULATORY_CARE_PROVIDER_SITE_OTHER): Payer: 59 | Admitting: Psychology

## 2019-02-10 DIAGNOSIS — F411 Generalized anxiety disorder: Secondary | ICD-10-CM

## 2019-02-26 ENCOUNTER — Ambulatory Visit (INDEPENDENT_AMBULATORY_CARE_PROVIDER_SITE_OTHER): Payer: 59 | Admitting: Psychology

## 2019-02-26 DIAGNOSIS — F411 Generalized anxiety disorder: Secondary | ICD-10-CM | POA: Diagnosis not present

## 2019-03-12 ENCOUNTER — Ambulatory Visit (INDEPENDENT_AMBULATORY_CARE_PROVIDER_SITE_OTHER): Payer: 59 | Admitting: Psychology

## 2019-03-12 DIAGNOSIS — F411 Generalized anxiety disorder: Secondary | ICD-10-CM | POA: Diagnosis not present

## 2019-03-26 ENCOUNTER — Ambulatory Visit (INDEPENDENT_AMBULATORY_CARE_PROVIDER_SITE_OTHER): Payer: 59 | Admitting: Psychology

## 2019-03-26 DIAGNOSIS — F411 Generalized anxiety disorder: Secondary | ICD-10-CM

## 2019-04-02 ENCOUNTER — Emergency Department (HOSPITAL_COMMUNITY): Payer: Managed Care, Other (non HMO)

## 2019-04-02 ENCOUNTER — Encounter (HOSPITAL_COMMUNITY): Payer: Self-pay | Admitting: Emergency Medicine

## 2019-04-02 ENCOUNTER — Emergency Department (HOSPITAL_COMMUNITY)
Admission: EM | Admit: 2019-04-02 | Discharge: 2019-04-02 | Disposition: A | Discharge status: Home / Self Care | Payer: Managed Care, Other (non HMO) | Attending: Emergency Medicine | Admitting: Emergency Medicine

## 2019-04-02 ENCOUNTER — Other Ambulatory Visit: Payer: Self-pay

## 2019-04-02 DIAGNOSIS — Z79899 Other long term (current) drug therapy: Secondary | ICD-10-CM | POA: Insufficient documentation

## 2019-04-02 DIAGNOSIS — S92512A Displaced fracture of proximal phalanx of left lesser toe(s), initial encounter for closed fracture: Secondary | ICD-10-CM | POA: Diagnosis not present

## 2019-04-02 DIAGNOSIS — S99922A Unspecified injury of left foot, initial encounter: Secondary | ICD-10-CM | POA: Diagnosis present

## 2019-04-02 DIAGNOSIS — Y939 Activity, unspecified: Secondary | ICD-10-CM | POA: Diagnosis not present

## 2019-04-02 DIAGNOSIS — I10 Essential (primary) hypertension: Secondary | ICD-10-CM | POA: Insufficient documentation

## 2019-04-02 DIAGNOSIS — Z7982 Long term (current) use of aspirin: Secondary | ICD-10-CM | POA: Insufficient documentation

## 2019-04-02 DIAGNOSIS — Y92009 Unspecified place in unspecified non-institutional (private) residence as the place of occurrence of the external cause: Secondary | ICD-10-CM | POA: Diagnosis not present

## 2019-04-02 DIAGNOSIS — W2209XA Striking against other stationary object, initial encounter: Secondary | ICD-10-CM | POA: Insufficient documentation

## 2019-04-02 DIAGNOSIS — Y999 Unspecified external cause status: Secondary | ICD-10-CM | POA: Insufficient documentation

## 2019-04-02 DIAGNOSIS — S92352A Displaced fracture of fifth metatarsal bone, left foot, initial encounter for closed fracture: Secondary | ICD-10-CM

## 2019-04-02 MED ORDER — LIDOCAINE HCL (PF) 1 % IJ SOLN
5.0000 mL | Freq: Once | INTRAMUSCULAR | Status: AC
  Administered 2019-04-02: 5 mL via INTRADERMAL
  Filled 2019-04-02: qty 5

## 2019-04-02 MED ORDER — ACETAMINOPHEN 325 MG PO TABS
650.0000 mg | ORAL_TABLET | Freq: Once | ORAL | Status: AC
  Administered 2019-04-02: 22:00:00 650 mg via ORAL
  Filled 2019-04-02: qty 2

## 2019-04-02 NOTE — Discharge Instructions (Signed)
A hard sole shoe has been placed to your left foot,please wear this while ambulating. Follow up with your Orthopedist on Monday at your scheduled appointment. You may alternate ibuprofen or tylenol for your pain.

## 2019-04-02 NOTE — ED Provider Notes (Signed)
[facility] Provider Note   CSN: [id number] Arrival date & time: 04/02/19  2033    History   Chief Complaint Chief Complaint  Patient presents with  . Toe Injury    HPI Abigail Garza is a 53 y.o. female.     53 y.o female with a PMH of HTN, Depression presents to the ED s/p left toe injury prior to arrival. Patient reports she was at home walking when she accidentally kicked the door at home, reports instant pain to the fourth and fifth metatarsal region. Reports she noted a deformity to the fifth toe.  Patient has not taken any medication for relieving symptoms.  States she is unable to ambulate on her left foot due to the pain on her fifth metatarsal.  Some discoloration noted to the left toe.  Denies any other deformity or injury at this time.     Past Medical History:  Diagnosis Date  . Complete tear of right ACL 01/21/2014  . Contact lens/glasses fitting    wears contacts or glasses  . Depression   . Hypertension   . PONV (postoperative nausea and vomiting)   . Tear of lateral meniscus of right knee 01/21/2014    Patient Active Problem List   Diagnosis Date Noted  . Complete tear of right ACL 01/21/2014  . Tear of lateral meniscus of right knee 01/21/2014    Past Surgical History:  Procedure Laterality Date  . ANTERIOR CRUCIATE LIGAMENT REPAIR Right 01/21/2014   Procedure: LATERAL  MENISCAL REPAIR ANTERIOR CRUCIATE LIGAMENT ( ACL) RECONSTRUCTION  RIGHT KNEE WITH ALLOGRAFT;  Surgeon: Eulas PostJoshua P Landau, MD;  Location: [facility];  Service: Orthopedics;  Laterality: Right;  . BREAST SURGERY  2001   lt br bx  . ENDOMETRIAL ABLATION  2014     OB History   No obstetric history on file.      Home Medications    Prior to Admission medications   Medication Sig Start Date End Date Taking? Authorizing Provider  aspirin EC 81 MG tablet Take 81 mg by mouth.    [provider]  citalopram (CELEXA) 10 MG  tablet Take 10 mg by mouth daily.    [provider]  fluticasone (FLONASE) 50 MCG/ACT nasal spray Place into the nose. 11/30/15   [provider]  furosemide (LASIX) 20 MG tablet Take 20 mg by mouth. Takes 1/2    [provider]  losartan (COZAAR) 50 MG tablet Take 50 mg by mouth daily.    [provider]  metoprolol succinate (TOPROL-XL) 50 MG 24 hr tablet Take 50 mg by mouth daily. 04/01/17   [provider]  Multiple Vitamin (MULTIVITAMIN) capsule Take by mouth.    [provider]  potassium chloride (K-DUR) 10 MEQ tablet Take 10 mEq by mouth daily. 04/25/17   [provider]    Family History History reviewed. No pertinent family history.  Social History Social History   Tobacco Use  . Smoking status: Never Smoker  . Smokeless tobacco: Never Used  Substance Use Topics  . Alcohol use: Yes    Comment: occ  . Drug use: No     Allergies   Lisinopril; Meloxicam; Sulfamethoxazole; Sulfa antibiotics; and Sulfasalazine   Review of Systems Review of Systems  Constitutional: Negative for fever.  Musculoskeletal: Positive for arthralgias. Negative for gait problem.     Physical Exam Updated Vital Signs BP 124/87 (BP Location: Left Arm)   Pulse 70  Temp 98.3 F (36.8 C) (Oral)   Resp 18   Ht  (1.727 m)   Wt 68 kg   SpO2 100%   BMI 22.81 kg/m   Physical Exam Vitals signs and nursing note reviewed.  Constitutional:      General: She is not in acute distress.    Appearance: She is well-developed.  HENT:     Head: Normocephalic and atraumatic.     Mouth/Throat:     Pharynx: No oropharyngeal exudate.  Eyes:     Pupils: Pupils are equal, round, and reactive to light.  Neck:     Musculoskeletal: Normal range of motion.  Cardiovascular:     Rate and Rhythm: Regular rhythm.     Pulses:          Dorsalis pedis pulses are 2+ on the left side.       Posterior tibial pulses are 2+ on the left side.     Heart  sounds: Normal heart sounds.  Pulmonary:     Effort: Pulmonary effort is normal. No respiratory distress.     Breath sounds: Normal breath sounds.  Abdominal:     General: Bowel sounds are normal. There is no distension.     Palpations: Abdomen is soft.     Tenderness: There is no abdominal tenderness.  Musculoskeletal:        General: No tenderness or deformity.     Right lower leg: No edema.     Left lower leg: No edema.  Feet:     Left foot:     Skin integrity: Skin integrity normal. No erythema.     Comments: Obvious fifth metatarsal deformity on left foot.  Some discoloration noted at the base.  Pulses intact, sensation is intact along with capillary refill. Skin:    General: Skin is warm and dry.  Neurological:     Mental Status: She is alert and oriented to person, place, and time.      ED Treatments / Results  Labs (all labs ordered are listed, but only abnormal results are displayed) Labs Reviewed - No data to display  EKG None  Radiology Dg Foot Complete Left  Result Date: 04/02/2019 CLINICAL DATA:  Left fifth toe injury with deformity and tenderness. EXAM: LEFT FOOT - COMPLETE 3+ VIEW COMPARISON:  05/06/2017 FINDINGS: There is an acute oblique fracture of the distal shaft of the proximal phalanx of the fifth toe which demonstrates moderate lateral displacement and lateral angulation. There is no dislocation. Mild marginal osteophyte formation is again seen at the first MTP joint. No destructive osseous process is identified. The soft tissues are unremarkable. IMPRESSION: Displaced and angulated fracture of the proximal phalanx of the fifth toe. Electronically Signed   By: Sebastian Ache M.D.   On: 04/02/2019 21:28    Procedures Reduction of fracture Date/Time: 04/02/2019 10:42 PM Performed by: Claude Manges, PA-C Authorized by: Claude Manges, PA-C  Consent: Verbal consent obtained. Consent given by: patient Patient identity confirmed: verbally with patient Local  anesthesia used: yes Anesthesia: digital block  Anesthesia: Local anesthesia used: yes Local Anesthetic: lidocaine 1% without epinephrine Anesthetic total: 2 mL  Sedation: Patient sedated: no  Patient tolerance: Patient tolerated the procedure well with no immediate complications Comments: Reduction of fracture of the fifth metatarsal.     (including critical care time)  Medications Ordered in ED Medications  acetaminophen (TYLENOL) tablet 650 mg (650 mg Oral Given 04/02/19 2142)  lidocaine (PF) (XYLOCAINE) 1 % injection 5 mL (5 mLs  Intradermal Given 04/02/19 2143)     Initial Impression / Assessment and Plan / ED Course  I have reviewed the triage vital signs and the nursing notes.  Pertinent labs & imaging results that were available during my care of the patient were reviewed by me and considered in my medical decision making (see chart for details).     Patient with a previous history of hypertension presents to the ED with complaints of left foot injury while at home after kicking a door accidentally.  Obvious deformity noted to left fifth metatarsal digit.  Reports some pain along fourth metatarsal as well, has full range of motion although limited on fifth metatarsal as it is obviously deformed.  Will obtain x-ray to screen for any dislocation or fracture. X-ray of her left foot showed: Displaced and angulated fracture of the proximal phalanx of the  fifth toe.     10:43 PM Patient received a digital block along with attempt at reduction of the fracture, toe was buddy tapped by me along with place on hard shoe.  She currently has an appointment with Delbert Harness on Monday to follow-up for her knee, she reports she will seek care at her appointment on Monday for her orthopedist follow-up.  She has been provided with a hard sole shoe, she is to alternate ibuprofen or Tylenol for pain along with elevation.  Patient understands and agrees with management this time.  Return  precautions provided.  Portions of this note were generated with Scientist, clinical (histocompatibility and immunogenetics). Dictation errors may occur despite best attempts at proofreading.    Final Clinical Impressions(s) / ED Diagnoses   Final diagnoses:  Displaced fracture of fifth metatarsal bone, left foot, initial encounter for closed fracture    ED Discharge Orders    None       Claude Manges, PA-C 04/02/19 2244    Melene Plan, DO 04/02/19 2307

## 2019-04-02 NOTE — ED Triage Notes (Signed)
Pt presents to ED c/o of L small toe pain. Pt kicked door approximately 20 mins ago. Toe clearly looks malpositioned. Pt c/o pain 9/10.

## 2019-04-15 ENCOUNTER — Ambulatory Visit (INDEPENDENT_AMBULATORY_CARE_PROVIDER_SITE_OTHER): Payer: 59 | Admitting: Psychology

## 2019-04-15 DIAGNOSIS — F411 Generalized anxiety disorder: Secondary | ICD-10-CM | POA: Diagnosis not present

## 2019-05-03 ENCOUNTER — Other Ambulatory Visit: Payer: Self-pay | Admitting: Internal Medicine

## 2019-05-03 DIAGNOSIS — Z1231 Encounter for screening mammogram for malignant neoplasm of breast: Secondary | ICD-10-CM

## 2019-05-04 ENCOUNTER — Ambulatory Visit (INDEPENDENT_AMBULATORY_CARE_PROVIDER_SITE_OTHER): Payer: 59 | Admitting: Psychology

## 2019-05-04 DIAGNOSIS — F411 Generalized anxiety disorder: Secondary | ICD-10-CM

## 2019-05-18 ENCOUNTER — Ambulatory Visit (INDEPENDENT_AMBULATORY_CARE_PROVIDER_SITE_OTHER): Payer: 59 | Admitting: Psychology

## 2019-05-18 DIAGNOSIS — F411 Generalized anxiety disorder: Secondary | ICD-10-CM | POA: Diagnosis not present

## 2019-06-08 ENCOUNTER — Ambulatory Visit (INDEPENDENT_AMBULATORY_CARE_PROVIDER_SITE_OTHER): Payer: 59 | Admitting: Psychology

## 2019-06-08 DIAGNOSIS — F411 Generalized anxiety disorder: Secondary | ICD-10-CM

## 2019-06-21 ENCOUNTER — Other Ambulatory Visit: Payer: Self-pay

## 2019-06-21 ENCOUNTER — Ambulatory Visit
Admission: RE | Admit: 2019-06-21 | Discharge: 2019-06-21 | Disposition: A | Discharge status: Home / Self Care | Payer: Managed Care, Other (non HMO) | Source: Ambulatory Visit | Attending: Internal Medicine | Admitting: Internal Medicine

## 2019-06-21 DIAGNOSIS — Z1231 Encounter for screening mammogram for malignant neoplasm of breast: Secondary | ICD-10-CM

## 2019-07-05 ENCOUNTER — Ambulatory Visit: Payer: 59 | Admitting: Psychology

## 2019-07-21 ENCOUNTER — Ambulatory Visit (INDEPENDENT_AMBULATORY_CARE_PROVIDER_SITE_OTHER): Payer: 59 | Admitting: Psychology

## 2019-07-21 DIAGNOSIS — F411 Generalized anxiety disorder: Secondary | ICD-10-CM

## 2019-08-16 ENCOUNTER — Ambulatory Visit (INDEPENDENT_AMBULATORY_CARE_PROVIDER_SITE_OTHER): Payer: 59 | Admitting: Psychology

## 2019-08-16 DIAGNOSIS — F411 Generalized anxiety disorder: Secondary | ICD-10-CM

## 2019-09-13 ENCOUNTER — Ambulatory Visit: Payer: 59 | Admitting: Psychology

## 2020-02-03 ENCOUNTER — Ambulatory Visit: Payer: BC Managed Care – PPO | Attending: Internal Medicine

## 2020-02-03 DIAGNOSIS — Z23 Encounter for immunization: Secondary | ICD-10-CM

## 2020-02-03 NOTE — Progress Notes (Signed)
   Covid-19 Vaccination Clinic  Name:  Abigail Garza    MRN: [id number] DOB: [birth year]  02/03/2020  [name] was observed post Covid-19 immunization for 15 minutes without incident. She was provided with Vaccine Information Sheet and instruction to access the V-Safe system.   [name] was instructed to call 911 with any severe reactions post vaccine: Marland Kitchen Difficulty breathing  . Swelling of face and throat  . A fast heartbeat  . A bad rash all over body  . Dizziness and weakness   Immunizations Administered    Name Date Dose VIS Date Route   Pfizer COVID-19 Vaccine 02/03/2020 12:40 PM 0.3 mL 11/05/2019 Intramuscular   Manufacturer: [employer], [employer]   Lot: [id number]   NDC: [address]-7

## 2020-02-28 ENCOUNTER — Ambulatory Visit: Payer: Self-pay

## 2020-02-28 ENCOUNTER — Ambulatory Visit: Payer: BC Managed Care – PPO | Attending: Internal Medicine

## 2020-02-28 DIAGNOSIS — Z23 Encounter for immunization: Secondary | ICD-10-CM

## 2020-02-28 NOTE — Progress Notes (Signed)
   Covid-19 Vaccination Clinic  Name:  Abigail Garza    MRN: [id number] DOB: [birth year]  02/28/2020  Abigail Garza was observed post Covid-19 immunization for 15 minutes without incident. She was provided with Vaccine Information Sheet and instruction to access the V-Safe system.   Abigail Garza was instructed to call 911 with any severe reactions post vaccine: Marland Kitchen Difficulty breathing  . Swelling of face and throat  . A fast heartbeat  . A bad rash all over body  . Dizziness and weakness   Immunizations Administered    Name Date Dose VIS Date Route   Pfizer COVID-19 Vaccine 02/28/2020  1:46 PM 0.3 mL 11/05/2019 Intramuscular   Manufacturer: [employer], [employer]   Lot: [id number]   NDC: [address]-9

## 2020-03-10 ENCOUNTER — Ambulatory Visit (INDEPENDENT_AMBULATORY_CARE_PROVIDER_SITE_OTHER): Payer: BC Managed Care – PPO | Admitting: Psychology

## 2020-03-10 DIAGNOSIS — F411 Generalized anxiety disorder: Secondary | ICD-10-CM

## 2020-04-19 IMAGING — CR LEFT FOOT - COMPLETE 3+ VIEW
3 series · 3 of 3 positions shown · non-contrast
Comparison: 05/06/2017

CLINICAL DATA: Left fifth toe injury with deformity and tenderness.

EXAM:
LEFT FOOT - COMPLETE 3+ VIEW

[foot ap]
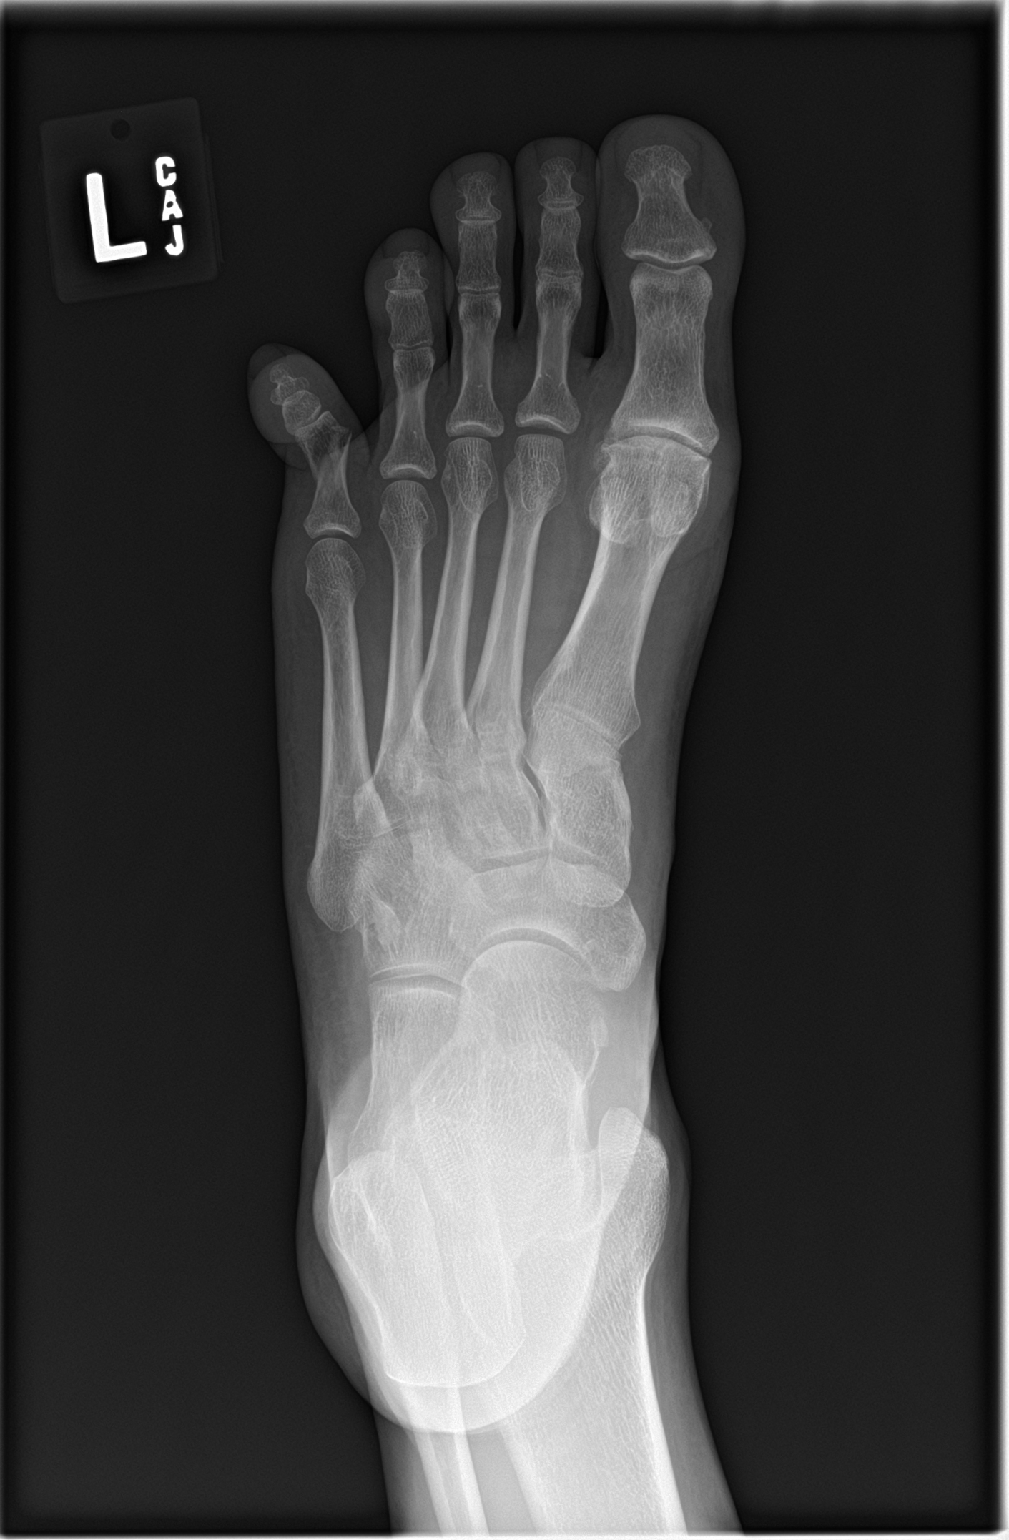

[foot obl]
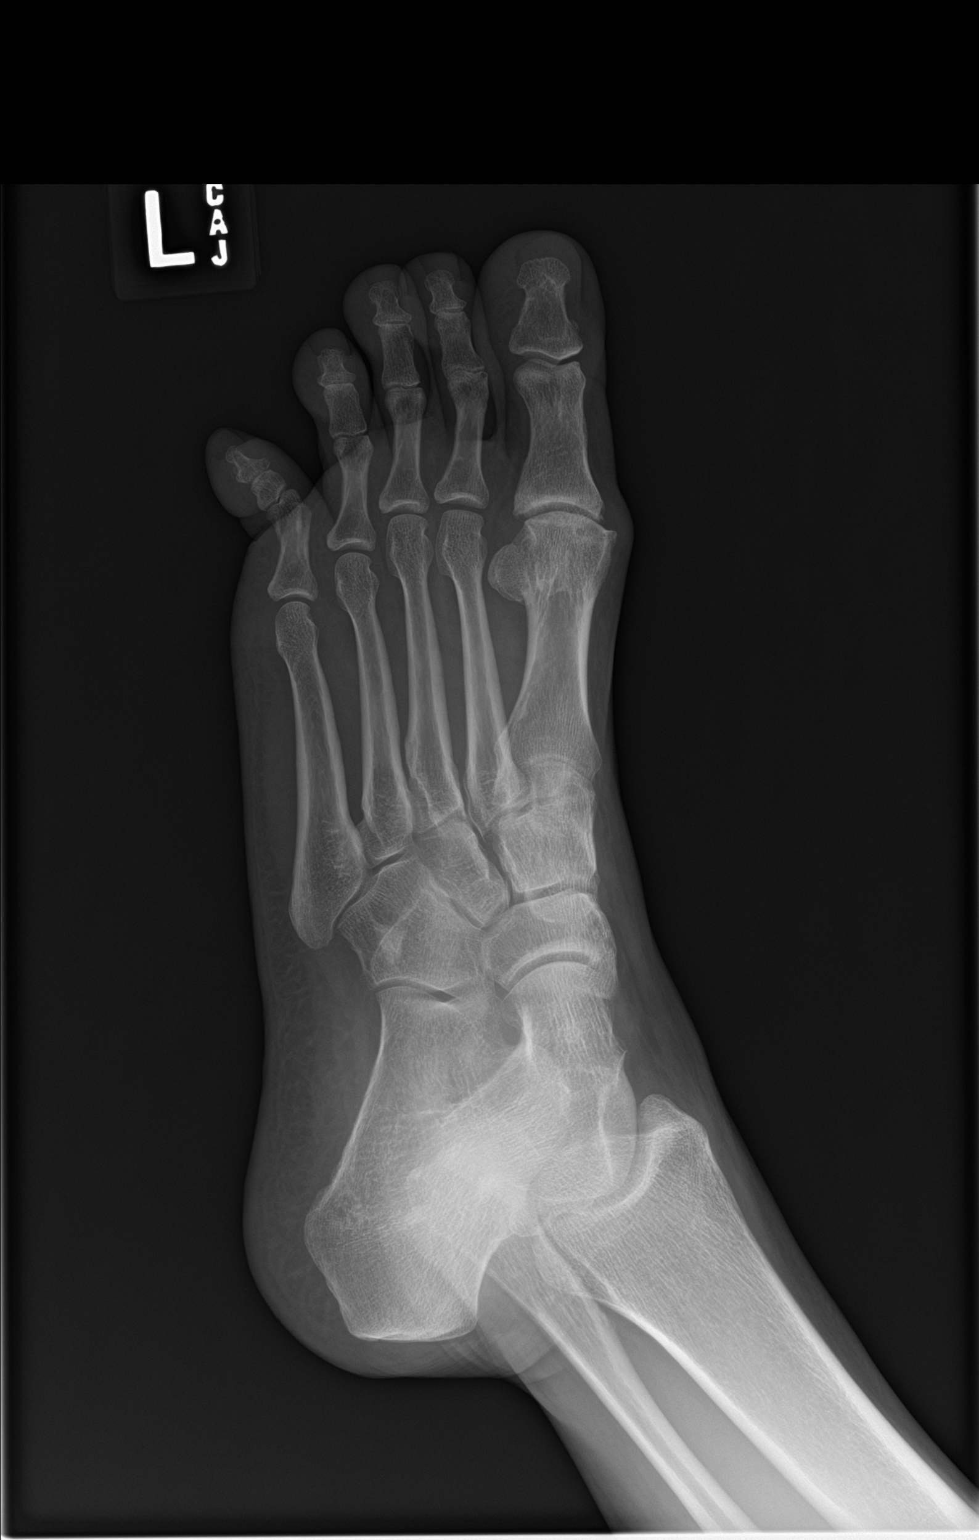

[foot lat]
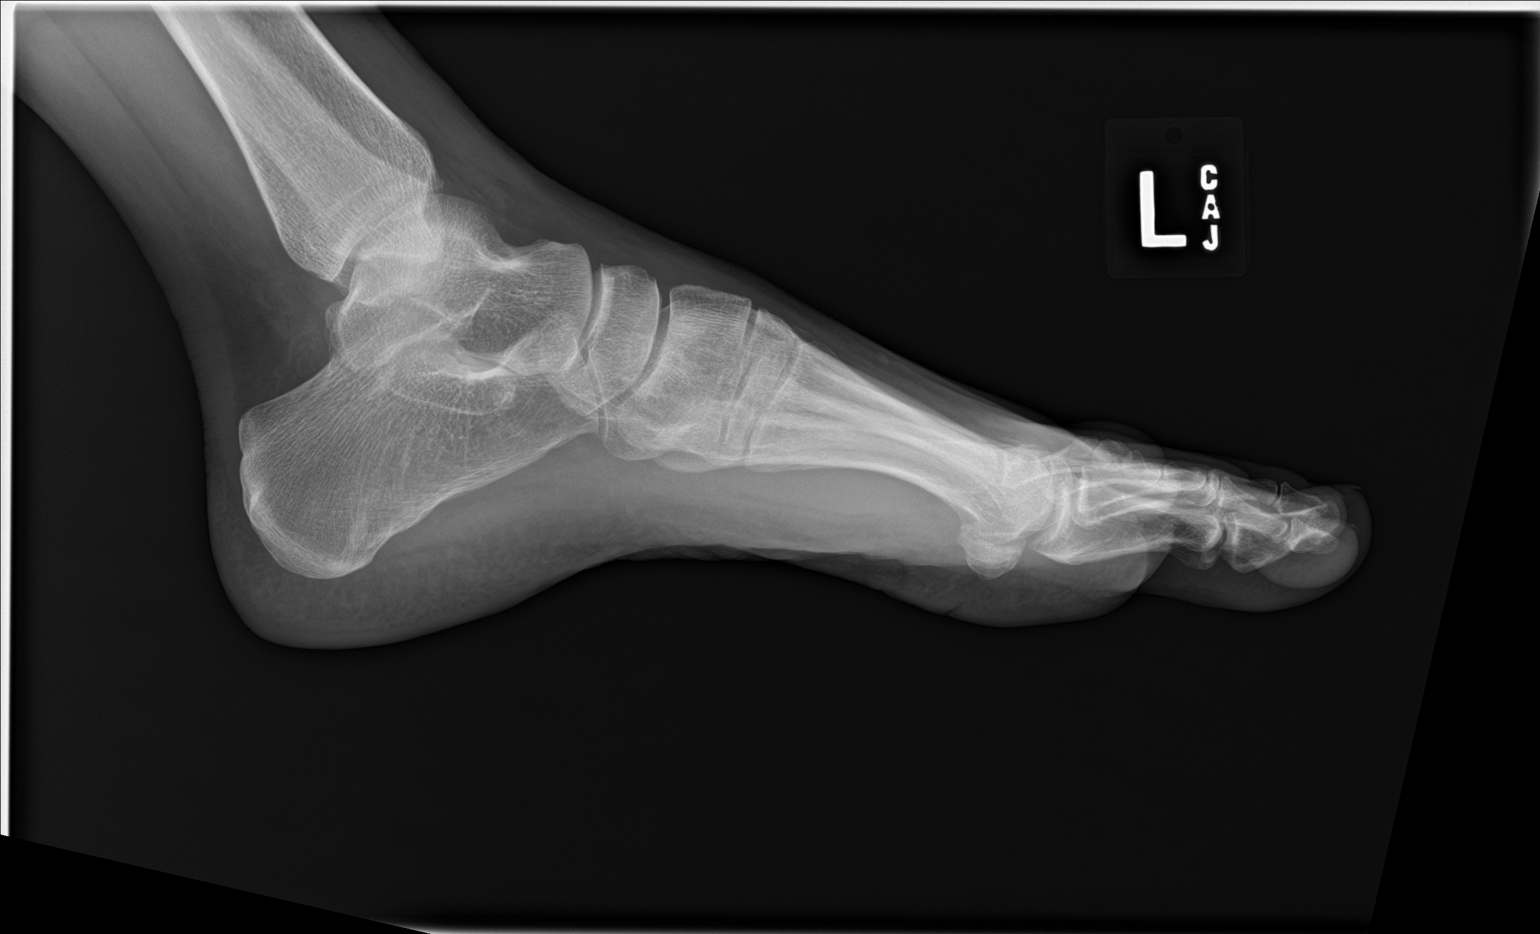

[3 of 3 positions shown; findings below may reference images not displayed]

FINDINGS: There is an acute oblique fracture of the distal shaft of the
proximal phalanx of the fifth toe which demonstrates moderate
lateral displacement and lateral angulation. There is no
dislocation. Mild marginal osteophyte formation is again seen at the
first MTP joint. No destructive osseous process is identified. The
soft tissues are unremarkable.
IMPRESSION: Displaced and angulated fracture of the proximal phalanx of the
fifth toe.

## 2020-06-06 ENCOUNTER — Ambulatory Visit (INDEPENDENT_AMBULATORY_CARE_PROVIDER_SITE_OTHER): Payer: BC Managed Care – PPO | Admitting: Psychology

## 2020-06-06 DIAGNOSIS — F411 Generalized anxiety disorder: Secondary | ICD-10-CM | POA: Diagnosis not present

## 2020-06-20 ENCOUNTER — Ambulatory Visit (INDEPENDENT_AMBULATORY_CARE_PROVIDER_SITE_OTHER): Payer: BC Managed Care – PPO | Admitting: Psychology

## 2020-06-20 DIAGNOSIS — F411 Generalized anxiety disorder: Secondary | ICD-10-CM

## 2020-07-03 ENCOUNTER — Ambulatory Visit (INDEPENDENT_AMBULATORY_CARE_PROVIDER_SITE_OTHER): Payer: BC Managed Care – PPO | Admitting: Psychology

## 2020-07-03 DIAGNOSIS — F411 Generalized anxiety disorder: Secondary | ICD-10-CM | POA: Diagnosis not present

## 2020-07-24 ENCOUNTER — Other Ambulatory Visit: Payer: Self-pay | Admitting: Family Medicine

## 2020-07-24 ENCOUNTER — Ambulatory Visit (INDEPENDENT_AMBULATORY_CARE_PROVIDER_SITE_OTHER): Payer: BC Managed Care – PPO | Admitting: Psychology

## 2020-07-24 DIAGNOSIS — F411 Generalized anxiety disorder: Secondary | ICD-10-CM

## 2020-07-24 DIAGNOSIS — Z1231 Encounter for screening mammogram for malignant neoplasm of breast: Secondary | ICD-10-CM

## 2020-08-03 ENCOUNTER — Other Ambulatory Visit: Payer: Self-pay

## 2020-08-03 ENCOUNTER — Ambulatory Visit
Admission: RE | Admit: 2020-08-03 | Discharge: 2020-08-03 | Disposition: A | Discharge status: Home / Self Care | Payer: BC Managed Care – PPO | Source: Ambulatory Visit

## 2020-08-03 DIAGNOSIS — Z1231 Encounter for screening mammogram for malignant neoplasm of breast: Secondary | ICD-10-CM

## 2020-08-07 ENCOUNTER — Other Ambulatory Visit: Payer: Self-pay | Admitting: Family Medicine

## 2020-08-07 DIAGNOSIS — R928 Other abnormal and inconclusive findings on diagnostic imaging of breast: Secondary | ICD-10-CM

## 2020-08-11 ENCOUNTER — Ambulatory Visit (INDEPENDENT_AMBULATORY_CARE_PROVIDER_SITE_OTHER): Payer: BC Managed Care – PPO | Admitting: Psychology

## 2020-08-11 DIAGNOSIS — F411 Generalized anxiety disorder: Secondary | ICD-10-CM | POA: Diagnosis not present

## 2020-08-18 ENCOUNTER — Ambulatory Visit
Admission: RE | Admit: 2020-08-18 | Discharge: 2020-08-18 | Disposition: A | Discharge status: Home / Self Care | Payer: BC Managed Care – PPO | Source: Ambulatory Visit | Attending: Family Medicine | Admitting: Family Medicine

## 2020-08-18 ENCOUNTER — Other Ambulatory Visit: Payer: Self-pay

## 2020-08-18 ENCOUNTER — Ambulatory Visit: Payer: BC Managed Care – PPO

## 2020-08-18 DIAGNOSIS — R928 Other abnormal and inconclusive findings on diagnostic imaging of breast: Secondary | ICD-10-CM

## 2020-08-23 ENCOUNTER — Ambulatory Visit (INDEPENDENT_AMBULATORY_CARE_PROVIDER_SITE_OTHER): Payer: BC Managed Care – PPO | Admitting: Psychology

## 2020-08-23 DIAGNOSIS — F411 Generalized anxiety disorder: Secondary | ICD-10-CM

## 2020-09-08 ENCOUNTER — Ambulatory Visit (INDEPENDENT_AMBULATORY_CARE_PROVIDER_SITE_OTHER): Payer: BC Managed Care – PPO | Admitting: Psychology

## 2020-09-08 DIAGNOSIS — F411 Generalized anxiety disorder: Secondary | ICD-10-CM

## 2020-09-28 ENCOUNTER — Ambulatory Visit (INDEPENDENT_AMBULATORY_CARE_PROVIDER_SITE_OTHER): Payer: BC Managed Care – PPO | Admitting: Psychology

## 2020-09-28 DIAGNOSIS — F411 Generalized anxiety disorder: Secondary | ICD-10-CM | POA: Diagnosis not present

## 2020-10-18 ENCOUNTER — Ambulatory Visit (INDEPENDENT_AMBULATORY_CARE_PROVIDER_SITE_OTHER): Payer: BC Managed Care – PPO | Admitting: Psychology

## 2020-10-18 DIAGNOSIS — F411 Generalized anxiety disorder: Secondary | ICD-10-CM | POA: Diagnosis not present

## 2020-11-08 ENCOUNTER — Ambulatory Visit: Payer: BC Managed Care – PPO | Admitting: Psychology

## 2020-12-01 ENCOUNTER — Ambulatory Visit (INDEPENDENT_AMBULATORY_CARE_PROVIDER_SITE_OTHER): Payer: BC Managed Care – PPO | Admitting: Psychology

## 2020-12-01 DIAGNOSIS — F411 Generalized anxiety disorder: Secondary | ICD-10-CM | POA: Diagnosis not present

## 2020-12-19 ENCOUNTER — Ambulatory Visit (INDEPENDENT_AMBULATORY_CARE_PROVIDER_SITE_OTHER): Payer: BC Managed Care – PPO | Admitting: Psychology

## 2020-12-19 DIAGNOSIS — F411 Generalized anxiety disorder: Secondary | ICD-10-CM | POA: Diagnosis not present

## 2021-01-16 ENCOUNTER — Ambulatory Visit (INDEPENDENT_AMBULATORY_CARE_PROVIDER_SITE_OTHER): Payer: BC Managed Care – PPO | Admitting: Psychology

## 2021-01-16 DIAGNOSIS — F411 Generalized anxiety disorder: Secondary | ICD-10-CM | POA: Diagnosis not present

## 2021-07-18 ENCOUNTER — Other Ambulatory Visit (HOSPITAL_BASED_OUTPATIENT_CLINIC_OR_DEPARTMENT_OTHER): Payer: Self-pay | Admitting: Internal Medicine

## 2021-07-18 DIAGNOSIS — Z1231 Encounter for screening mammogram for malignant neoplasm of breast: Secondary | ICD-10-CM

## 2021-08-21 ENCOUNTER — Other Ambulatory Visit: Payer: Self-pay

## 2021-08-21 ENCOUNTER — Ambulatory Visit (HOSPITAL_BASED_OUTPATIENT_CLINIC_OR_DEPARTMENT_OTHER)
Admission: RE | Admit: 2021-08-21 | Discharge: 2021-08-21 | Disposition: A | Discharge status: Home / Self Care | Payer: BC Managed Care – PPO | Source: Ambulatory Visit | Attending: Internal Medicine | Admitting: Internal Medicine

## 2021-08-21 ENCOUNTER — Encounter (HOSPITAL_BASED_OUTPATIENT_CLINIC_OR_DEPARTMENT_OTHER): Payer: Self-pay

## 2021-08-21 DIAGNOSIS — Z1231 Encounter for screening mammogram for malignant neoplasm of breast: Secondary | ICD-10-CM | POA: Insufficient documentation

## 2021-09-05 IMAGING — MG MM DIGITAL DIAGNOSTIC UNILAT*R* W/ TOMO W/ CAD
4 series · 4 of 12 positions shown · non-contrast
Comparison: Previous exam(s).

CLINICAL DATA: 54-year-old female for further evaluation of
possible RIGHT breast mass on screening mammogram.

EXAM:
DIGITAL DIAGNOSTIC UNILATERAL RIGHT MAMMOGRAM WITH TOMO AND CAD

[R ML synth-2D]
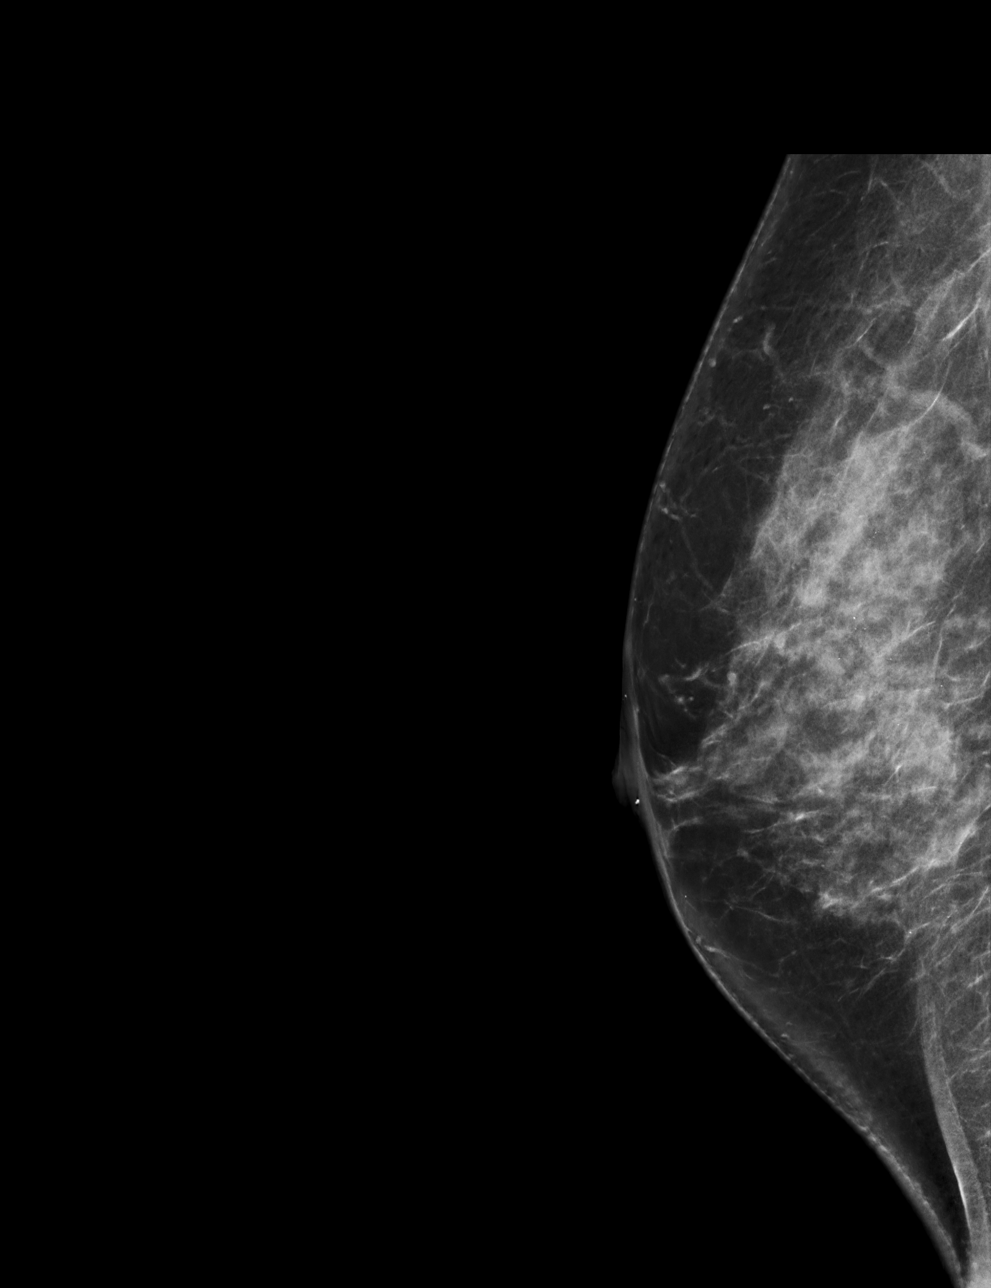

[R CC synth-2D]
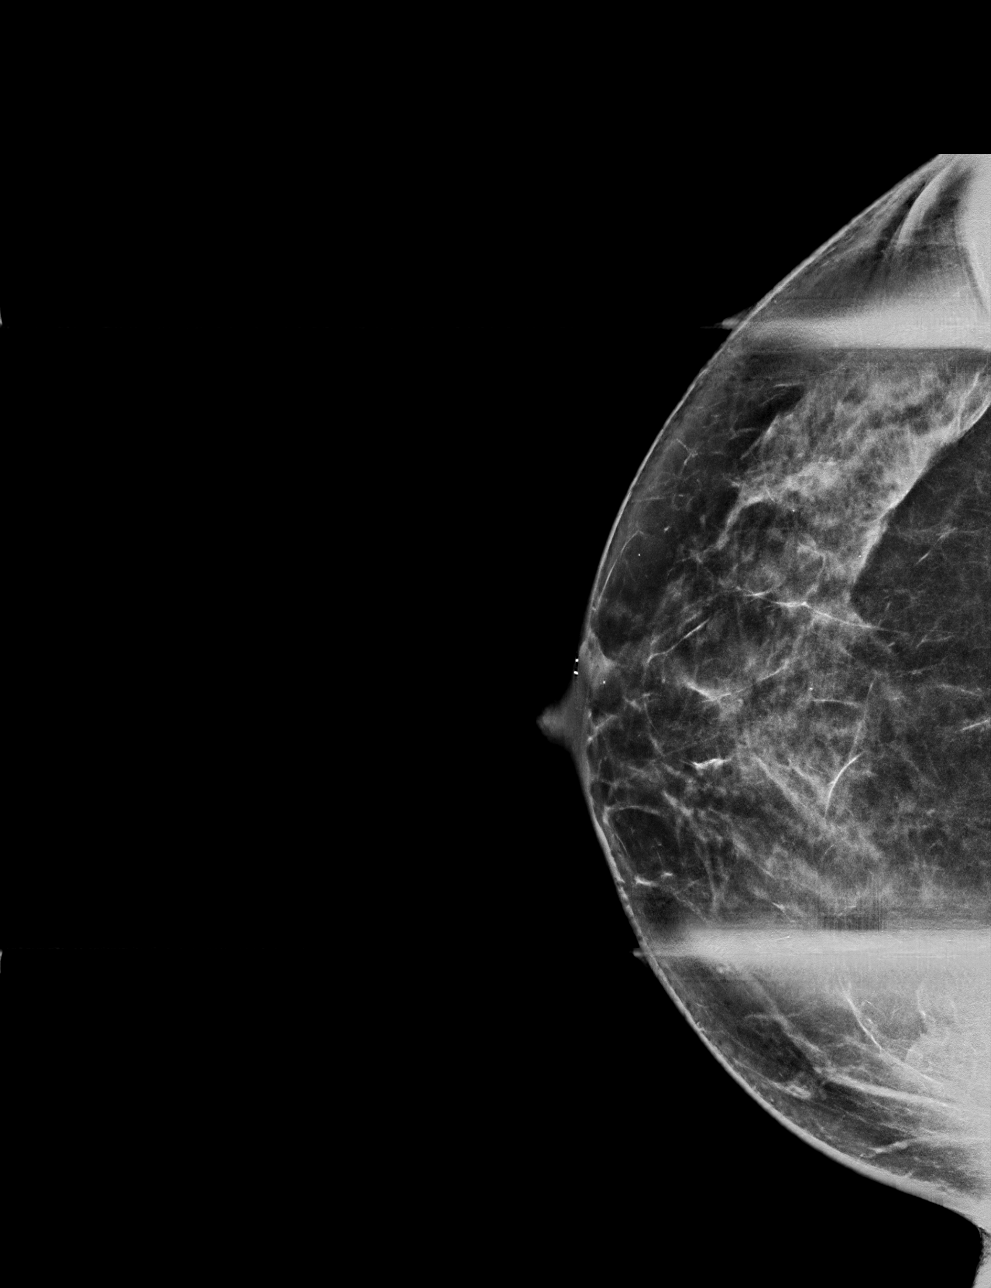

[R CC tomo · tomo slice 31/62.0]
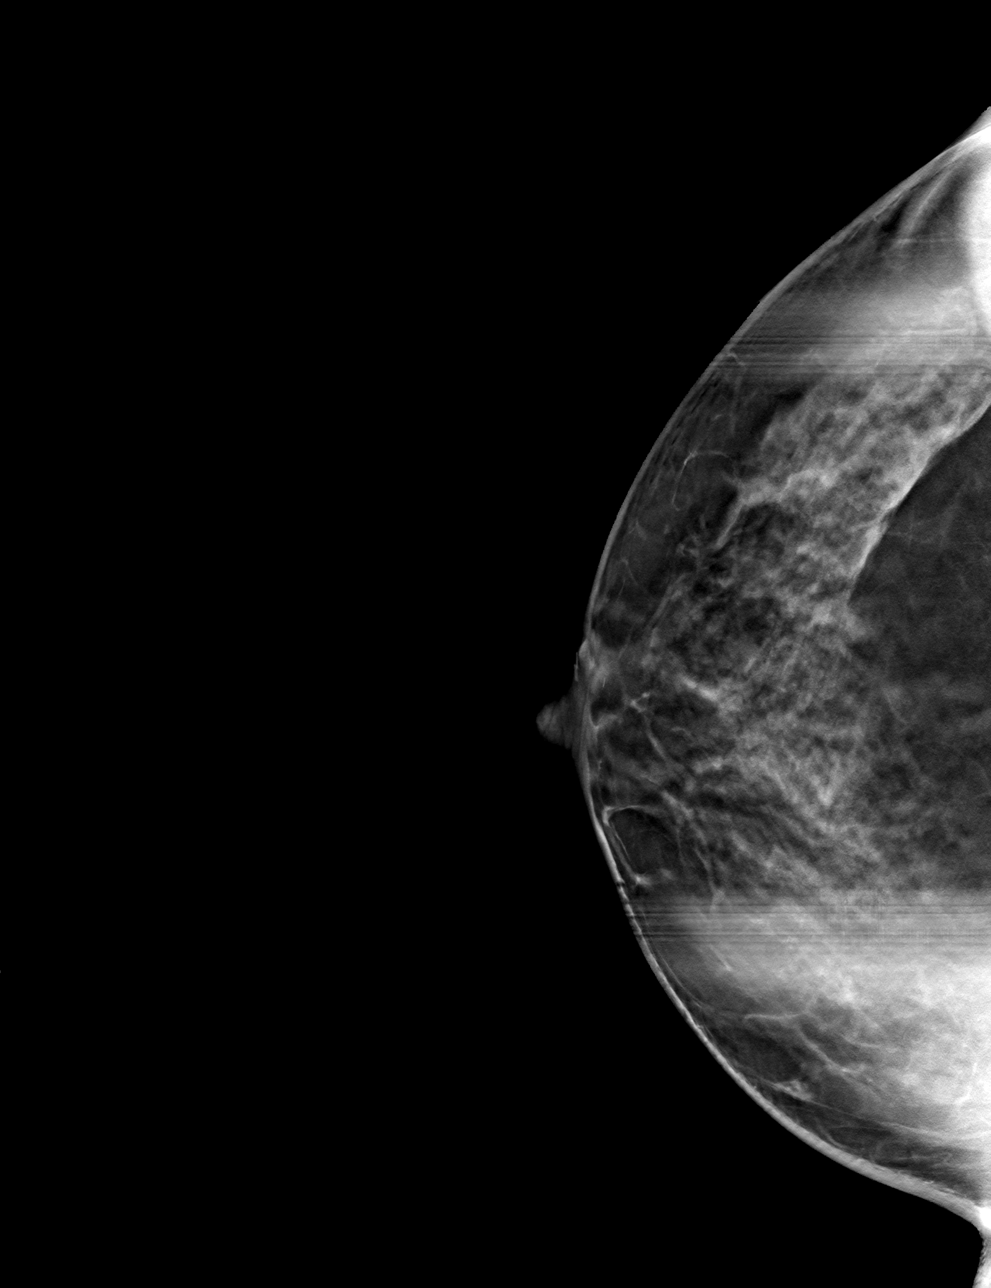

[R ML tomo · tomo slice 39/77.0]
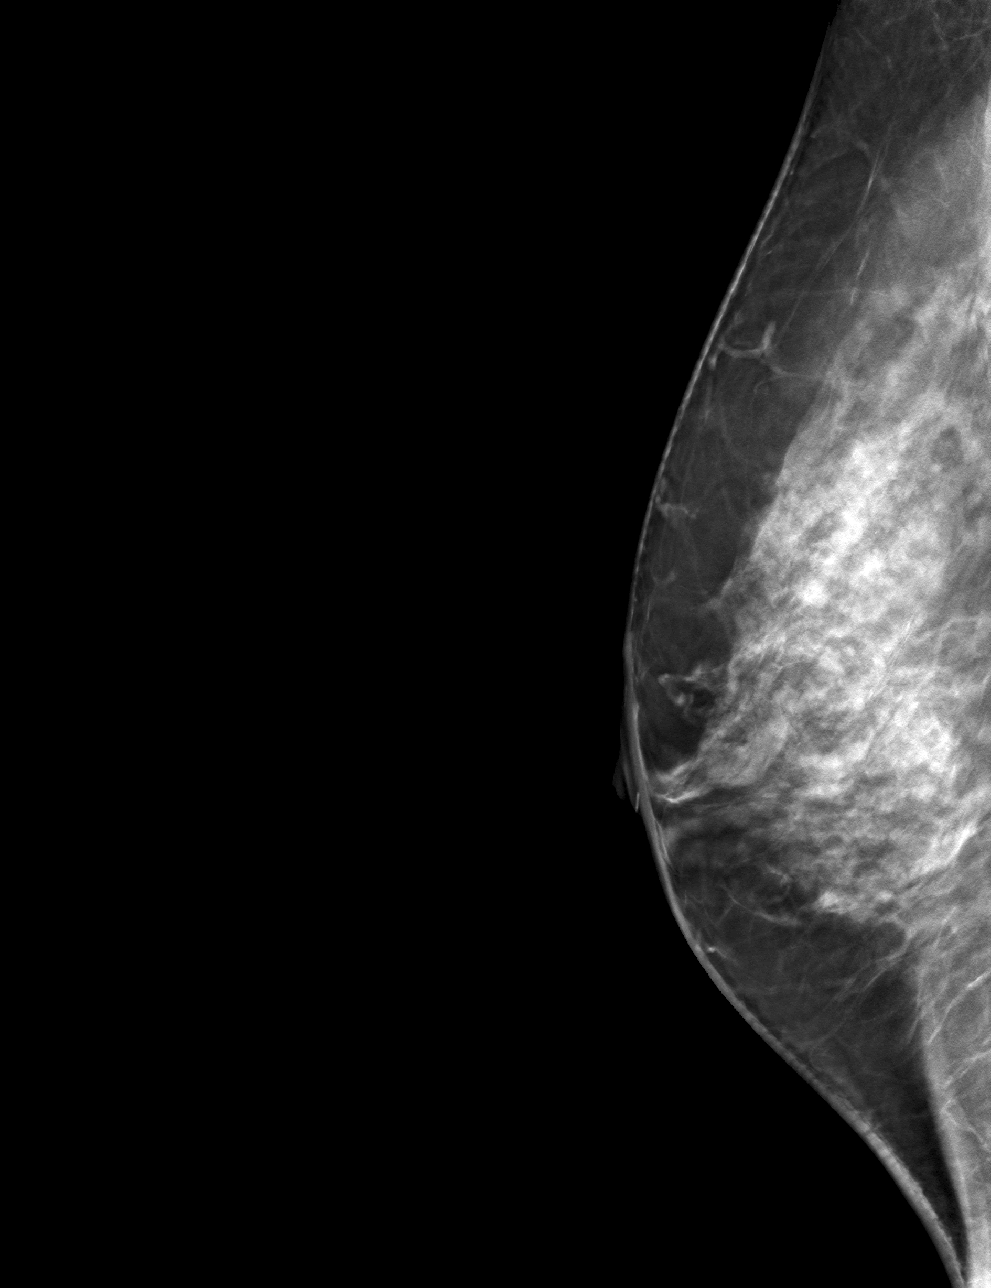

[4 of 12 positions shown; findings below may reference images not displayed]

ACR Breast Density Category c: The breast tissue is heterogeneously
dense, which may obscure small masses.
FINDINGS: 2D/3D full field and spot compression views of the RIGHT breast
demonstrate no persistent abnormality at the site of the screening
study finding.

Mammographic images were processed with CAD.
IMPRESSION: No persistent abnormality at the site of the screening study
finding.

RECOMMENDATION:
Bilateral screening mammogram in 1 year.

I have discussed the findings and recommendations with the patient.
If applicable, a reminder letter will be sent to the patient
regarding the next appointment.

BI-RADS CATEGORY  1: Negative.

## 2022-09-16 ENCOUNTER — Other Ambulatory Visit: Payer: Self-pay | Admitting: Family Medicine

## 2022-09-16 DIAGNOSIS — Z1231 Encounter for screening mammogram for malignant neoplasm of breast: Secondary | ICD-10-CM

## 2022-11-06 ENCOUNTER — Ambulatory Visit
Admission: RE | Admit: 2022-11-06 | Discharge: 2022-11-06 | Disposition: A | Discharge status: Home / Self Care | Payer: BC Managed Care – PPO | Source: Ambulatory Visit | Attending: Family Medicine | Admitting: Family Medicine

## 2022-11-06 DIAGNOSIS — Z1231 Encounter for screening mammogram for malignant neoplasm of breast: Secondary | ICD-10-CM

## 2023-10-14 ENCOUNTER — Other Ambulatory Visit: Payer: Self-pay | Admitting: Family Medicine

## 2023-10-14 DIAGNOSIS — Z1231 Encounter for screening mammogram for malignant neoplasm of breast: Secondary | ICD-10-CM

## 2023-11-20 ENCOUNTER — Ambulatory Visit
Admission: RE | Admit: 2023-11-20 | Discharge: 2023-11-20 | Disposition: A | Discharge status: Home / Self Care | Payer: BC Managed Care – PPO | Source: Ambulatory Visit | Attending: Family Medicine | Admitting: Family Medicine

## 2023-11-20 DIAGNOSIS — Z1231 Encounter for screening mammogram for malignant neoplasm of breast: Secondary | ICD-10-CM

## 2023-11-26 ENCOUNTER — Other Ambulatory Visit: Payer: Self-pay | Admitting: Medical Genetics

## 2023-12-17 ENCOUNTER — Other Ambulatory Visit (HOSPITAL_COMMUNITY): Payer: Self-pay | Attending: Medical Genetics

## 2024-05-21 ENCOUNTER — Other Ambulatory Visit (HOSPITAL_COMMUNITY)
Admission: RE | Admit: 2024-05-21 | Discharge: 2024-05-21 | Disposition: A | Discharge status: Home / Self Care | Payer: Self-pay | Source: Ambulatory Visit | Attending: Medical Genetics | Admitting: Medical Genetics

## 2024-06-04 LAB — GENECONNECT MOLECULAR SCREEN: Genetic Analysis Overall Interpretation: NEGATIVE

## 2024-12-13 ENCOUNTER — Other Ambulatory Visit: Payer: Self-pay | Admitting: Family Medicine

## 2024-12-13 DIAGNOSIS — Z1231 Encounter for screening mammogram for malignant neoplasm of breast: Secondary | ICD-10-CM

## 2024-12-21 ENCOUNTER — Ambulatory Visit: Admission: RE | Admit: 2024-12-21 | Discharge: 2024-12-21 | Disposition: A | Source: Ambulatory Visit

## 2024-12-21 DIAGNOSIS — Z1231 Encounter for screening mammogram for malignant neoplasm of breast: Secondary | ICD-10-CM
